# Patient Record
Sex: Female | Born: 1955 | Race: White | Hispanic: No | Smoking: Never smoker
Health system: Southern US, Community
[De-identification: ages and names within clinical notes are randomized; demographics above are authoritative.]

## PROBLEM LIST (undated history)

## (undated) DIAGNOSIS — G4700 Insomnia, unspecified: Secondary | ICD-10-CM

## (undated) DIAGNOSIS — J45909 Unspecified asthma, uncomplicated: Secondary | ICD-10-CM

## (undated) DIAGNOSIS — T8859XA Other complications of anesthesia, initial encounter: Secondary | ICD-10-CM

## (undated) DIAGNOSIS — K219 Gastro-esophageal reflux disease without esophagitis: Secondary | ICD-10-CM

## (undated) DIAGNOSIS — M797 Fibromyalgia: Secondary | ICD-10-CM

## (undated) DIAGNOSIS — K5792 Diverticulitis of intestine, part unspecified, without perforation or abscess without bleeding: Secondary | ICD-10-CM

## (undated) DIAGNOSIS — F419 Anxiety disorder, unspecified: Secondary | ICD-10-CM

## (undated) DIAGNOSIS — Z889 Allergy status to unspecified drugs, medicaments and biological substances status: Secondary | ICD-10-CM

## (undated) DIAGNOSIS — N819 Female genital prolapse, unspecified: Secondary | ICD-10-CM

## (undated) DIAGNOSIS — J342 Deviated nasal septum: Secondary | ICD-10-CM

## (undated) DIAGNOSIS — F32A Depression, unspecified: Secondary | ICD-10-CM

## (undated) DIAGNOSIS — F329 Major depressive disorder, single episode, unspecified: Secondary | ICD-10-CM

## (undated) DIAGNOSIS — E079 Disorder of thyroid, unspecified: Secondary | ICD-10-CM

## (undated) DIAGNOSIS — T4145XA Adverse effect of unspecified anesthetic, initial encounter: Secondary | ICD-10-CM

## (undated) HISTORY — PX: VAGINAL PROLAPSE REPAIR: SHX830

## (undated) HISTORY — DX: Disorder of thyroid, unspecified: E07.9

## (undated) HISTORY — DX: Diverticulitis of intestine, part unspecified, without perforation or abscess without bleeding: K57.92

## (undated) HISTORY — PX: TONSILLECTOMY: SUR1361

## (undated) HISTORY — PX: CARPAL TUNNEL RELEASE: SHX101

## (undated) HISTORY — PX: EXCISIONAL HEMORRHOIDECTOMY: SHX1541

## (undated) HISTORY — DX: Female genital prolapse, unspecified: N81.9

---

## 1994-03-29 HISTORY — PX: ABDOMINAL HYSTERECTOMY: SHX81

## 2002-08-23 ENCOUNTER — Encounter: Payer: Self-pay | Admitting: Internal Medicine

## 2002-08-23 ENCOUNTER — Ambulatory Visit (HOSPITAL_COMMUNITY): Admission: RE | Admit: 2002-08-23 | Discharge: 2002-08-23 | Payer: Self-pay | Admitting: Internal Medicine

## 2004-04-10 ENCOUNTER — Ambulatory Visit (HOSPITAL_COMMUNITY): Admission: RE | Admit: 2004-04-10 | Discharge: 2004-04-10 | Payer: Self-pay | Admitting: Internal Medicine

## 2005-06-07 ENCOUNTER — Ambulatory Visit (HOSPITAL_COMMUNITY): Admission: RE | Admit: 2005-06-07 | Discharge: 2005-06-07 | Payer: Self-pay | Admitting: Obstetrics and Gynecology

## 2006-09-21 ENCOUNTER — Ambulatory Visit (HOSPITAL_COMMUNITY): Admission: RE | Admit: 2006-09-21 | Discharge: 2006-09-21 | Payer: Self-pay | Admitting: Obstetrics and Gynecology

## 2007-10-17 ENCOUNTER — Ambulatory Visit (HOSPITAL_COMMUNITY): Admission: RE | Admit: 2007-10-17 | Discharge: 2007-10-17 | Payer: Self-pay | Admitting: Obstetrics and Gynecology

## 2008-11-28 ENCOUNTER — Ambulatory Visit (HOSPITAL_COMMUNITY): Admission: RE | Admit: 2008-11-28 | Discharge: 2008-11-28 | Payer: Self-pay | Admitting: Obstetrics and Gynecology

## 2010-02-20 ENCOUNTER — Ambulatory Visit (HOSPITAL_COMMUNITY): Admission: RE | Admit: 2010-02-20 | Discharge: 2010-02-20 | Payer: Self-pay | Admitting: Obstetrics and Gynecology

## 2010-04-19 ENCOUNTER — Encounter: Payer: Self-pay | Admitting: Obstetrics and Gynecology

## 2011-01-28 HISTORY — PX: CHOLECYSTECTOMY: SHX55

## 2011-03-02 ENCOUNTER — Other Ambulatory Visit (HOSPITAL_COMMUNITY): Payer: Self-pay | Admitting: Obstetrics and Gynecology

## 2011-03-02 DIAGNOSIS — Z1231 Encounter for screening mammogram for malignant neoplasm of breast: Secondary | ICD-10-CM

## 2011-03-29 ENCOUNTER — Ambulatory Visit
Admission: RE | Admit: 2011-03-29 | Discharge: 2011-03-29 | Disposition: A | Discharge status: Home / Self Care | Payer: BC Managed Care – PPO | Source: Ambulatory Visit | Attending: Obstetrics and Gynecology | Admitting: Obstetrics and Gynecology

## 2011-03-29 DIAGNOSIS — Z1231 Encounter for screening mammogram for malignant neoplasm of breast: Secondary | ICD-10-CM

## 2011-09-21 ENCOUNTER — Encounter (INDEPENDENT_AMBULATORY_CARE_PROVIDER_SITE_OTHER): Payer: Self-pay | Admitting: General Surgery

## 2011-09-21 ENCOUNTER — Ambulatory Visit (INDEPENDENT_AMBULATORY_CARE_PROVIDER_SITE_OTHER): Payer: BC Managed Care – PPO | Admitting: General Surgery

## 2011-09-21 VITALS — BP 118/74 | HR 74 | Temp 97.1°F | Ht 68.0 in | Wt 144.8 lb

## 2011-09-21 DIAGNOSIS — N6452 Nipple discharge: Secondary | ICD-10-CM

## 2011-09-21 DIAGNOSIS — N6459 Other signs and symptoms in breast: Secondary | ICD-10-CM

## 2011-09-21 NOTE — Progress Notes (Addendum)
Patient ID: Mindy Grant, female   DOB: 12/05/1955, 56 y.o.   MRN: [id number]  Chief Complaint  Patient presents with  . other    eval nipple d/c    HPI Mindy Grant is a 56 y.o. female.   HPI 40 yof who presents with about an 8 month history of left sided spontaneous clear nipple discharge.  It took her a while to figure out it was from her nipple. She had one episode of some pink discharge.  She reports no prior breast history and no other breast complaints.  She has no family history.  She notes discharge mostly on her gown when she gets up in the morning.  She underwent nl mmg and an u/s that shows dilated subareolar ducts.  She comes in today to discuss options.  Past Medical History  Diagnosis Date  . Thyroid disease     Past Surgical History  Procedure Date  . Cholecystectomy 01/2011  . Abdominal hysterectomy 1996    Family History  Problem Relation Age of Onset  . Cancer Mother     lung    Social History History  Substance Use Topics  . Smoking status: Never Smoker   . Smokeless tobacco: Not on file  . Alcohol Use: No    Allergies  Allergen Reactions  . Codeine Itching    Current Outpatient Prescriptions  Medication Sig Dispense Refill  . ALPRAZolam (XANAX) 0.5 MG tablet       . bumetanide (BUMEX) 1 MG tablet       . buPROPion (WELLBUTRIN XL) 150 MG 24 hr tablet       . fluconazole (DIFLUCAN) 150 MG tablet       . ketoconazole (NIZORAL) 2 % cream       . levothyroxine (SYNTHROID, LEVOTHROID) 75 MCG tablet       . meloxicam (MOBIC) 15 MG tablet       . omeprazole (PRILOSEC) 40 MG capsule       . SUMAtriptan (IMITREX) 100 MG tablet       . SUPRAX 400 MG tablet       . VIVELLE-DOT 0.1 MG/24HR       . cephALEXin (KEFLEX) 500 MG capsule       . ciprofloxacin (CIPRO) 500 MG tablet         Review of Systems Review of Systems  Constitutional: Negative for fever, chills and unexpected weight change.  HENT: Negative for hearing loss, congestion, sore throat,  trouble swallowing and voice change.   Eyes: Negative for visual disturbance.  Respiratory: Negative for cough and wheezing.   Cardiovascular: Positive for leg swelling (left side). Negative for chest pain and palpitations.  Gastrointestinal: Positive for nausea. Negative for vomiting, abdominal pain, diarrhea, constipation, blood in stool, abdominal distention and anal bleeding.  Genitourinary: Negative for hematuria, vaginal bleeding and difficulty urinating.  Musculoskeletal: Negative for arthralgias.  Skin: Negative for rash and wound.  Neurological: Positive for headaches. Negative for seizures and syncope.  Hematological: Negative for adenopathy. Does not bruise/bleed easily.  Psychiatric/Behavioral: Negative for confusion.    Blood pressure 118/74, pulse 74, temperature 97.1 F (36.2 C), temperature source Temporal, height 5\' 8"  (1.727 m), weight 144 lb 12.8 oz (65.681 kg), SpO2 97.00%.  Physical Exam Physical Exam  Vitals reviewed. Constitutional: She appears well-developed and well-nourished.  Cardiovascular: Normal rate, regular rhythm and normal heart sounds.   Pulmonary/Chest: Effort normal and breath sounds normal. She has no wheezes. She has no rales. Right breast  exhibits no inverted nipple, no mass, no nipple discharge, no skin change and no tenderness. Left breast exhibits nipple discharge (single central duct with yellow discharge, she reports no change in her nipple appearance she has noticed). Left breast exhibits no inverted nipple, no mass, no skin change and no tenderness. Breasts are symmetrical.  Lymphadenopathy:    She has no cervical adenopathy.    Data Reviewed Mammogram and u/s reports and images viewed from outside facility  Assessment    Left nipple discharge    Plan    She has unilateral spontaneous longstanding left nipple discharge.  We discussed continued observation, additional imaging or proceeding to duct excision.  I do think she will need  surgery but we are going to proceed with attempt at ductogram first and then I will see her back.       Nichalos Brenton 09/21/2011, 10:02 PM

## 2011-09-22 ENCOUNTER — Telehealth (INDEPENDENT_AMBULATORY_CARE_PROVIDER_SITE_OTHER): Payer: Self-pay

## 2011-09-22 NOTE — Telephone Encounter (Signed)
Returned my call. I gave her the info on her appts. The pt had lots of questions about whether she needed to have the ductogram done and was this the same procedure as having cancer. I advised pt that she needed to get this done and this was not the same as having breast cancer. I advised pt that we would call pt after her results are completed.

## 2011-09-22 NOTE — Telephone Encounter (Signed)
LMOM at home and cell to call me b/c I have her scheduled for a ductogram at [facility] on 6/28 arrive at 10:15 for 10:30. I also moved her f/u appt with Dr Dwain Sarna to 7/9 11:30

## 2011-09-24 ENCOUNTER — Ambulatory Visit
Admission: RE | Admit: 2011-09-24 | Discharge: 2011-09-24 | Disposition: A | Discharge status: Home / Self Care | Payer: BC Managed Care – PPO | Source: Ambulatory Visit | Attending: General Surgery | Admitting: General Surgery

## 2011-09-24 ENCOUNTER — Other Ambulatory Visit (INDEPENDENT_AMBULATORY_CARE_PROVIDER_SITE_OTHER): Payer: Self-pay | Admitting: General Surgery

## 2011-09-24 DIAGNOSIS — N6452 Nipple discharge: Secondary | ICD-10-CM

## 2011-09-29 ENCOUNTER — Ambulatory Visit (INDEPENDENT_AMBULATORY_CARE_PROVIDER_SITE_OTHER): Payer: BC Managed Care – PPO | Admitting: General Surgery

## 2011-09-29 ENCOUNTER — Encounter (INDEPENDENT_AMBULATORY_CARE_PROVIDER_SITE_OTHER): Payer: Self-pay | Admitting: General Surgery

## 2011-09-29 VITALS — BP 128/86 | HR 70 | Temp 97.2°F | Resp 12 | Ht 67.0 in | Wt 141.8 lb

## 2011-09-29 DIAGNOSIS — N6452 Nipple discharge: Secondary | ICD-10-CM | POA: Insufficient documentation

## 2011-09-29 DIAGNOSIS — N6459 Other signs and symptoms in breast: Secondary | ICD-10-CM

## 2011-09-29 NOTE — Progress Notes (Signed)
Subjective:     Patient ID: Mindy Grant, female   DOB: 05/04/1955, 56 y.o.   MRN: [id number]  HPI  This is a 56 yo female I recently saw with a history of spontaneous left-sided nipple discharge. I sent her for an ultrasound and  attempt at a ductogram last week with the results as listed below. She comes in today to discuss these results. She reports no interval complaints. She continues to have left-sided discharge. Review of Systems LEFT BREAST ULTRASOUND  Comparison: Prior studies  On physical exam, single duct clear yellow nipple discharge was  elicited from a centrally located duct - left nipple. There is no  palpable mass.  Findings: Ultrasound is performed, showing dilated left subareolar  ducts. 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 There are  no additional findings.  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 Surgical excision is recommended. The patient plans  to see Dr. Kayona Foor for follow-up evaluation on 10/05/2011.  RECOMMENDATION:  Surgical excision.  BI-RADS CATEGORY 4: Suspicious abnormality - biopsy should be  considered.     Objective:   Physical Exam     Assessment:     Left nipple discharge    Plan:     We discussed excision of left breast ductal system due to her symptoms as well as the ultrasound appearance. I discussed the procedure as well as the postoperative recovery. We discussed the risk of bleeding and infection. We discussed we're doing this did prove that there is breast cancer associated with it. We will plan to do this next week.      

## 2011-10-04 ENCOUNTER — Encounter (HOSPITAL_BASED_OUTPATIENT_CLINIC_OR_DEPARTMENT_OTHER): Payer: Self-pay | Admitting: *Deleted

## 2011-10-04 ENCOUNTER — Encounter (HOSPITAL_BASED_OUTPATIENT_CLINIC_OR_DEPARTMENT_OTHER)
Admission: RE | Admit: 2011-10-04 | Discharge: 2011-10-04 | Disposition: A | Discharge status: Home / Self Care | Payer: BC Managed Care – PPO | Source: Ambulatory Visit | Attending: General Surgery | Admitting: General Surgery

## 2011-10-04 LAB — CBC
HCT: 38.5 % (ref 36.0–46.0)
Hemoglobin: 13.1 g/dL (ref 12.0–15.0)
MCH: 30.4 pg (ref 26.0–34.0)
MCHC: 34 g/dL (ref 30.0–36.0)
MCV: 89.3 fL (ref 78.0–100.0)
RBC: 4.31 MIL/uL (ref 3.87–5.11)

## 2011-10-04 LAB — BASIC METABOLIC PANEL
BUN: 9 mg/dL (ref 6–23)
CO2: 25 mEq/L (ref 19–32)
Chloride: 101 mEq/L (ref 96–112)
Glucose, Bld: 109 mg/dL — ABNORMAL HIGH (ref 70–99)
Potassium: 3.6 mEq/L (ref 3.5–5.1)

## 2011-10-04 NOTE — Progress Notes (Signed)
To come in for preop labs per dr wakefield Pt nervous-does not sleep well Says heart rate will go up when nervous-no meds-never had to see a dr.

## 2011-10-05 ENCOUNTER — Telehealth (INDEPENDENT_AMBULATORY_CARE_PROVIDER_SITE_OTHER): Payer: Self-pay | Admitting: General Surgery

## 2011-10-05 ENCOUNTER — Encounter (INDEPENDENT_AMBULATORY_CARE_PROVIDER_SITE_OTHER): Payer: BC Managed Care – PPO | Admitting: General Surgery

## 2011-10-05 ENCOUNTER — Ambulatory Visit (INDEPENDENT_AMBULATORY_CARE_PROVIDER_SITE_OTHER): Payer: Self-pay | Admitting: General Surgery

## 2011-10-05 NOTE — Telephone Encounter (Signed)
Patient called in explaining that she is having an issue with anxiety due to her surgery tomorrow.  She explained that she has a Rx from Dr. Donnie Mesa her GYN for this problem before but she states the xanax makes her hyper.  I explained to her that because he is the one who prescribed the medication that she should call his office and ask him change the Rx to something else.  She said she would do this.

## 2011-10-06 ENCOUNTER — Ambulatory Visit (HOSPITAL_BASED_OUTPATIENT_CLINIC_OR_DEPARTMENT_OTHER)
Admission: RE | Admit: 2011-10-06 | Discharge: 2011-10-06 | Disposition: A | Discharge status: Home / Self Care | Payer: BC Managed Care – PPO | Source: Ambulatory Visit | Attending: General Surgery | Admitting: General Surgery

## 2011-10-06 ENCOUNTER — Encounter (HOSPITAL_BASED_OUTPATIENT_CLINIC_OR_DEPARTMENT_OTHER): Payer: Self-pay | Admitting: *Deleted

## 2011-10-06 ENCOUNTER — Encounter (HOSPITAL_BASED_OUTPATIENT_CLINIC_OR_DEPARTMENT_OTHER)
Admission: RE | Disposition: A | Discharge status: Home / Self Care | Payer: Self-pay | Source: Ambulatory Visit | Attending: General Surgery

## 2011-10-06 ENCOUNTER — Encounter (HOSPITAL_BASED_OUTPATIENT_CLINIC_OR_DEPARTMENT_OTHER): Payer: Self-pay | Admitting: Certified Registered"

## 2011-10-06 ENCOUNTER — Ambulatory Visit (HOSPITAL_BASED_OUTPATIENT_CLINIC_OR_DEPARTMENT_OTHER): Payer: BC Managed Care – PPO | Admitting: Certified Registered"

## 2011-10-06 ENCOUNTER — Telehealth (INDEPENDENT_AMBULATORY_CARE_PROVIDER_SITE_OTHER): Payer: Self-pay | Admitting: General Surgery

## 2011-10-06 DIAGNOSIS — N6029 Fibroadenosis of unspecified breast: Secondary | ICD-10-CM | POA: Insufficient documentation

## 2011-10-06 DIAGNOSIS — D249 Benign neoplasm of unspecified breast: Secondary | ICD-10-CM

## 2011-10-06 DIAGNOSIS — N6459 Other signs and symptoms in breast: Secondary | ICD-10-CM | POA: Insufficient documentation

## 2011-10-06 DIAGNOSIS — K219 Gastro-esophageal reflux disease without esophagitis: Secondary | ICD-10-CM | POA: Insufficient documentation

## 2011-10-06 HISTORY — DX: Other complications of anesthesia, initial encounter: T88.59XA

## 2011-10-06 HISTORY — DX: Major depressive disorder, single episode, unspecified: F32.9

## 2011-10-06 HISTORY — DX: Adverse effect of unspecified anesthetic, initial encounter: T41.45XA

## 2011-10-06 HISTORY — DX: Deviated nasal septum: J34.2

## 2011-10-06 HISTORY — PX: BREAST DUCTAL SYSTEM EXCISION: SHX5242

## 2011-10-06 HISTORY — DX: Anxiety disorder, unspecified: F41.9

## 2011-10-06 HISTORY — DX: Fibromyalgia: M79.7

## 2011-10-06 HISTORY — DX: Depression, unspecified: F32.A

## 2011-10-06 HISTORY — DX: Gastro-esophageal reflux disease without esophagitis: K21.9

## 2011-10-06 HISTORY — DX: Insomnia, unspecified: G47.00

## 2011-10-06 HISTORY — DX: Unspecified asthma, uncomplicated: J45.909

## 2011-10-06 HISTORY — DX: Allergy status to unspecified drugs, medicaments and biological substances: Z88.9

## 2011-10-06 SURGERY — EXCISION DUCTAL SYSTEM BREAST
Anesthesia: General | Site: Breast | Laterality: Left | Wound class: Clean

## 2011-10-06 MED ORDER — OXYCODONE-ACETAMINOPHEN 5-325 MG PO TABS
1.0000 | ORAL_TABLET | Freq: Once | ORAL | Status: AC
  Administered 2011-10-06: 1 via ORAL

## 2011-10-06 MED ORDER — DEXAMETHASONE SODIUM PHOSPHATE 4 MG/ML IJ SOLN
INTRAMUSCULAR | Status: DC | PRN
  Administered 2011-10-06: 6 mg via INTRAVENOUS

## 2011-10-06 MED ORDER — FENTANYL CITRATE 0.05 MG/ML IJ SOLN
50.0000 ug | INTRAMUSCULAR | Status: AC | PRN
  Administered 2011-10-06 (×2): 25 ug via INTRAVENOUS

## 2011-10-06 MED ORDER — HYDROMORPHONE HCL PF 1 MG/ML IJ SOLN
0.2500 mg | INTRAMUSCULAR | Status: DC | PRN
  Administered 2011-10-06: 0.25 mg via INTRAVENOUS

## 2011-10-06 MED ORDER — MIDAZOLAM HCL 5 MG/5ML IJ SOLN
INTRAMUSCULAR | Status: DC | PRN
  Administered 2011-10-06: 1 mg via INTRAVENOUS

## 2011-10-06 MED ORDER — ONDANSETRON HCL 4 MG/2ML IJ SOLN
INTRAMUSCULAR | Status: DC | PRN
  Administered 2011-10-06: 4 mg via INTRAVENOUS

## 2011-10-06 MED ORDER — PROMETHAZINE HCL 25 MG RE SUPP
25.0000 mg | Freq: Once | RECTAL | Status: AC
  Administered 2011-10-06: 25 mg via RECTAL

## 2011-10-06 MED ORDER — FENTANYL CITRATE 0.05 MG/ML IJ SOLN
INTRAMUSCULAR | Status: DC | PRN
  Administered 2011-10-06: 50 ug via INTRAVENOUS
  Administered 2011-10-06: 25 ug via INTRAVENOUS

## 2011-10-06 MED ORDER — PROMETHAZINE HCL 25 MG/ML IJ SOLN: 6.2500 mg | Freq: Once | INTRAMUSCULAR | Status: DC

## 2011-10-06 MED ORDER — 0.9 % SODIUM CHLORIDE (POUR BTL) OPTIME
TOPICAL | Status: DC | PRN
  Administered 2011-10-06: 1000 mL

## 2011-10-06 MED ORDER — BUPIVACAINE HCL (PF) 0.25 % IJ SOLN
INTRAMUSCULAR | Status: DC | PRN
  Administered 2011-10-06: 10 mL

## 2011-10-06 MED ORDER — LIDOCAINE HCL (CARDIAC) 20 MG/ML IV SOLN
INTRAVENOUS | Status: DC | PRN
  Administered 2011-10-06: 60 mg via INTRAVENOUS

## 2011-10-06 MED ORDER — LACTATED RINGERS IV SOLN
INTRAVENOUS | Status: DC
  Administered 2011-10-06 (×2): via INTRAVENOUS

## 2011-10-06 MED ORDER — PROPOFOL 10 MG/ML IV EMUL
INTRAVENOUS | Status: DC | PRN
  Administered 2011-10-06: 250 mg via INTRAVENOUS

## 2011-10-06 MED ORDER — OXYCODONE-ACETAMINOPHEN 5-325 MG PO TABS: 1.0000 | ORAL_TABLET | ORAL | Status: AC | PRN

## 2011-10-06 SURGICAL SUPPLY — 56 items
ADH SKN CLS APL DERMABOND .7 (GAUZE/BANDAGES/DRESSINGS)
APL SKNCLS STERI-STRIP NONHPOA (GAUZE/BANDAGES/DRESSINGS) ×1
APPLIER CLIP 9.375 MED OPEN (MISCELLANEOUS)
APR CLP MED 9.3 20 MLT OPN (MISCELLANEOUS)
BENZOIN TINCTURE PRP APPL 2/3 (GAUZE/BANDAGES/DRESSINGS) ×2 IMPLANT
BINDER BREAST LRG (GAUZE/BANDAGES/DRESSINGS) IMPLANT
BINDER BREAST MEDIUM (GAUZE/BANDAGES/DRESSINGS) IMPLANT
BINDER BREAST XLRG (GAUZE/BANDAGES/DRESSINGS) IMPLANT
BINDER BREAST XXLRG (GAUZE/BANDAGES/DRESSINGS) IMPLANT
BLADE SURG 15 STRL LF DISP TIS (BLADE) ×1 IMPLANT
BLADE SURG 15 STRL SS (BLADE) ×2
CANISTER SUCTION 1200CC (MISCELLANEOUS) IMPLANT
CHLORAPREP W/TINT 26ML (MISCELLANEOUS) ×2 IMPLANT
CLIP APPLIE 9.375 MED OPEN (MISCELLANEOUS) IMPLANT
CLOTH BEACON ORANGE TIMEOUT ST (SAFETY) ×2 IMPLANT
COVER MAYO STAND STRL (DRAPES) ×2 IMPLANT
COVER TABLE BACK 60X90 (DRAPES) ×2 IMPLANT
DECANTER SPIKE VIAL GLASS SM (MISCELLANEOUS) IMPLANT
DERMABOND ADVANCED (GAUZE/BANDAGES/DRESSINGS)
DERMABOND ADVANCED .7 DNX12 (GAUZE/BANDAGES/DRESSINGS) IMPLANT
DEVICE DUBIN W/COMP PLATE 8390 (MISCELLANEOUS) IMPLANT
DRAPE PED LAPAROTOMY (DRAPES) ×2 IMPLANT
DRSG TEGADERM 4X4.75 (GAUZE/BANDAGES/DRESSINGS) ×2 IMPLANT
ELECT COATED BLADE 2.86 ST (ELECTRODE) ×2 IMPLANT
ELECT REM PT RETURN 9FT ADLT (ELECTROSURGICAL) ×2
ELECTRODE REM PT RTRN 9FT ADLT (ELECTROSURGICAL) ×1 IMPLANT
GAUZE SPONGE 4X4 12PLY STRL LF (GAUZE/BANDAGES/DRESSINGS) ×2 IMPLANT
GLOVE BIO SURGEON STRL SZ7 (GLOVE) ×2 IMPLANT
GLOVE BIO SURGEON STRL SZ7.5 (GLOVE) ×1 IMPLANT
GLOVE BIOGEL PI IND STRL 7.5 (GLOVE) ×1 IMPLANT
GLOVE BIOGEL PI IND STRL 8 (GLOVE) IMPLANT
GLOVE BIOGEL PI INDICATOR 7.5 (GLOVE) ×1
GLOVE BIOGEL PI INDICATOR 8 (GLOVE) ×1
GOWN PREVENTION PLUS XLARGE (GOWN DISPOSABLE) ×2 IMPLANT
GOWN STRL REIN 2XL LVL4 (GOWN DISPOSABLE) ×1 IMPLANT
NDL HYPO 25X1 1.5 SAFETY (NEEDLE) ×1 IMPLANT
NEEDLE HYPO 25X1 1.5 SAFETY (NEEDLE) ×2 IMPLANT
NS IRRIG 1000ML POUR BTL (IV SOLUTION) ×1 IMPLANT
PACK BASIN DAY SURGERY FS (CUSTOM PROCEDURE TRAY) ×2 IMPLANT
PENCIL BUTTON HOLSTER BLD 10FT (ELECTRODE) ×2 IMPLANT
SLEEVE SCD COMPRESS KNEE MED (MISCELLANEOUS) ×2 IMPLANT
SPONGE LAP 4X18 X RAY DECT (DISPOSABLE) ×2 IMPLANT
STRIP CLOSURE SKIN 1/2X4 (GAUZE/BANDAGES/DRESSINGS) ×2 IMPLANT
SUT MNCRL AB 4-0 PS2 18 (SUTURE) ×2 IMPLANT
SUT MON AB 5-0 PS2 18 (SUTURE) ×1 IMPLANT
SUT SILK 2 0 SH (SUTURE) ×2 IMPLANT
SUT VIC AB 2-0 SH 27 (SUTURE)
SUT VIC AB 2-0 SH 27XBRD (SUTURE) ×1 IMPLANT
SUT VIC AB 3-0 SH 27 (SUTURE) ×2
SUT VIC AB 3-0 SH 27X BRD (SUTURE) ×1 IMPLANT
SYR CONTROL 10ML LL (SYRINGE) ×2 IMPLANT
TOWEL OR 17X24 6PK STRL BLUE (TOWEL DISPOSABLE) ×2 IMPLANT
TOWEL OR NON WOVEN STRL DISP B (DISPOSABLE) ×2 IMPLANT
TUBE CONNECTING 20X1/4 (TUBING) IMPLANT
WATER STERILE IRR 1000ML POUR (IV SOLUTION) ×1 IMPLANT
YANKAUER SUCT BULB TIP NO VENT (SUCTIONS) IMPLANT

## 2011-10-06 NOTE — Interval H&P Note (Signed)
History and Physical Interval Note:  10/06/2011 8:22 AM  Mindy Grant  has presented today for surgery, with the diagnosis of left nipple discharge  The various methods of treatment have been discussed with the patient and family. After consideration of risks, benefits and other options for treatment, the patient has consented to  Procedure(s) (LRB): EXCISION DUCTAL SYSTEM BREAST (Left) as a surgical intervention .  The patient's history has been reviewed, patient examined, no change in status, stable for surgery.  I have reviewed the patients' chart and labs.  Questions were answered to the patient's satisfaction.     [name]

## 2011-10-06 NOTE — Anesthesia Procedure Notes (Signed)
Procedure Name: LMA Insertion Date/Time: 10/06/2011 8:40 AM Performed by: Verlan Friends Pre-anesthesia Checklist: Patient identified, Emergency Drugs available, Suction available, Patient being monitored and Timeout performed Patient Re-evaluated:Patient Re-evaluated prior to inductionOxygen Delivery Method: Circle System Utilized Preoxygenation: Pre-oxygenation with 100% oxygen Intubation Type: IV induction Ventilation: Mask ventilation without difficulty LMA: LMA inserted LMA Size: 4.0 Number of attempts: 1 Airway Equipment and Method: bite block Placement Confirmation: positive ETCO2 Tube secured with: Tape Dental Injury: Teeth and Oropharynx as per pre-operative assessment

## 2011-10-06 NOTE — Op Note (Signed)
Preoperative diagnosis: Left breast nipple discharge with abnormal ductogram Postoperative diagnoses: Same as above Procedure: Left breast duct excision Surgeon: Dr. Harden Mo Anesthesia: Gen. With LMA Estimated blood loss: Minimal Specimens: #1 retroareolar breast tissue #2 left breast ductal system marked with short stitch superior, long stitch lateral, double stitch deep Drains: None Complications: None Disposition to recovery room in stable condition Sponge and needle count was correct x2 at operation  Indications: This 56 year old female with a history of spontaneous, unilateral clear breast discharge. This has persisted. She underwent an ultrasound and a ductogram showing dilated ducts as well as some wall thickening in a retroareolar duct. She and I discussed excision of this ductal system.  Procedure: After informed consent was obtained the patient was taken the operating room. She had sequential compression devices placed on the legs. She was then placed under general anesthesia with an LMA. She was prepped and draped in the standard sterile surgical fashion. A surgical timeout was performed.  I made a periareolar incision on the lateral portion of her left nipple areolar complex. I cannulated the leaking duct with a lacrimal duct probe. I then proceeded to excise this ductal system in its entirety. There was still a small amount of retroareolar tissue excised with a knife and sent as a separate specimen. There was no other abnormal tissue present. I then obtained hemostasis. I then closed the deep breast tissue with a 3-0 Vicryl suture. The dermis was closed with 3-0 Vicryl and the skin was closed with a 5-0 Monocryl. I then infiltrated 10 cc of quarter percent Marcaine. I then placed Steri-Strips and a sterile dressing. She tolerated as well as activity and transferred to recovery in stable.

## 2011-10-06 NOTE — Anesthesia Postprocedure Evaluation (Signed)
  Anesthesia Post-op Note  Patient: Mindy Grant  Procedure(s) Performed: Procedure(s) (LRB): EXCISION DUCTAL SYSTEM BREAST (Left)  Patient Location: PACU  Anesthesia Type: General  Level of Consciousness: awake  Airway and Oxygen Therapy: Patient Spontanous Breathing and Patient connected to face mask oxygen  Post-op Pain: mild  Post-op Assessment: Post-op Vital signs reviewed, Patient's Cardiovascular Status Stable, Respiratory Function Stable, Patent Airway and No signs of Nausea or vomiting  Post-op Vital Signs: Reviewed and stable  Complications: No apparent anesthesia complications

## 2011-10-06 NOTE — Anesthesia Preprocedure Evaluation (Signed)
Anesthesia Evaluation  Patient identified by MRN, date of birth, ID band Patient awake    Reviewed: Allergy & Precautions, H&P , NPO status , Patient's Chart, lab work & pertinent test results  History of Anesthesia Complications (+) PROLONGED EMERGENCE  Airway Mallampati: I TM Distance: >3 FB Neck ROM: Full    Dental No notable dental hx. (+) Teeth Intact and Dental Advisory Given   Pulmonary neg pulmonary ROS,  breath sounds clear to auscultation  Pulmonary exam normal       Cardiovascular negative cardio ROS  Rhythm:Regular Rate:Normal     Neuro/Psych PSYCHIATRIC DISORDERS negative neurological ROS     GI/Hepatic Neg liver ROS, GERD-  Medicated and Controlled,  Endo/Other  negative endocrine ROS  Renal/GU negative Renal ROS  negative genitourinary   Musculoskeletal   Abdominal   Peds  Hematology negative hematology ROS (+)   Anesthesia Other Findings   Reproductive/Obstetrics negative OB ROS                           Anesthesia Physical Anesthesia Plan  ASA: II  Anesthesia Plan: General   Post-op Pain Management:    Induction: Intravenous  Airway Management Planned: LMA  Additional Equipment:   Intra-op Plan:   Post-operative Plan: Extubation in OR  Informed Consent: I have reviewed the patients History and Physical, chart, labs and discussed the procedure including the risks, benefits and alternatives for the proposed anesthesia with the patient or authorized representative who has indicated his/her understanding and acceptance.   Dental advisory given  Plan Discussed with: CRNA  Anesthesia Plan Comments:         Anesthesia Quick Evaluation

## 2011-10-06 NOTE — H&P (View-Only) (Signed)
Subjective:     Patient ID: Mindy Grant, female   DOB: 1956-01-24, 56 y.o.   MRN: [id number]  HPI  This is a 56 yo female I recently saw with a history of spontaneous left-sided nipple discharge. I sent her for an ultrasound and  attempt at a ductogram last week with the results as listed below. She comes in today to discuss these results. She reports no interval complaints. She continues to have left-sided discharge. Review of Systems LEFT BREAST ULTRASOUND  Comparison: Prior studies  On physical exam, single duct clear yellow nipple discharge was  elicited from a centrally located duct - left nipple. There is no  palpable mass.  Findings: Ultrasound is performed, showing dilated left subareolar  ducts. 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 There are  no additional findings.  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 Surgical excision is recommended. The patient plans  to see Dr. Dwain Sarna for follow-up evaluation on 10/05/2011.  RECOMMENDATION:  Surgical excision.  BI-RADS CATEGORY 4: Suspicious abnormality - biopsy should be  considered.     Objective:   Physical Exam     Assessment:     Left nipple discharge    Plan:     We discussed excision of left breast ductal system due to her symptoms as well as the ultrasound appearance. I discussed the procedure as well as the postoperative recovery. We discussed the risk of bleeding and infection. We discussed we're doing this did prove that there is breast cancer associated with it. We will plan to do this next week.

## 2011-10-06 NOTE — Progress Notes (Signed)
Notified Dr. Sampson Goon of pt's heart rate 40's after giving Dilaudid 0.25mg  and pt c/o pain in left breast -- ordered to give Fentanyl and watch Heart rate.

## 2011-10-06 NOTE — Transfer of Care (Signed)
Immediate Anesthesia Transfer of Care Note  Patient: Mindy Grant  Procedure(s) Performed: Procedure(s) (LRB): EXCISION DUCTAL SYSTEM BREAST (Left)  Patient Location: PACU  Anesthesia Type: General  Level of Consciousness: awake, alert , oriented and patient cooperative  Airway & Oxygen Therapy: Patient Spontanous Breathing and Patient connected to face mask oxygen  Post-op Assessment: Report given to PACU RN and Post -op Vital signs reviewed and stable  Post vital signs: Reviewed and stable  Complications: No apparent anesthesia complications

## 2011-10-06 NOTE — Telephone Encounter (Signed)
Pt's daughter calling to ask if Dr. Dwain Sarna would order antibiotics for her mother. She had surgery today, and is now coughing up "green stuff" and has a tight chest.  Dr. Dwain Sarna paged and updated; declined to order meds a this time, stating this happens sometimes after ET tube placement.  Called daughter and explained same, stating she should be encourage to continue to cough it up and spit it out.   Daughter then states she was coughing and congested before surgery.  Instructed daughter to watch pt closely and monitor for fever.  Encourage to cough and expectorate.  If URI, antibx will not cure a viral infection anyway.  Call back for fever >101.

## 2011-10-06 NOTE — Progress Notes (Signed)
Pt c/o of nausea. Dr Sampson Goon alerted, orders given.  Watching HR at this time. HR as low as 44 at times, and then right back into 60's.  Will continue to monitor closely, and alert Dr.  Zollie Scale, pt talking, all other VSS, no symptoms at this time.

## 2011-10-08 ENCOUNTER — Telehealth (INDEPENDENT_AMBULATORY_CARE_PROVIDER_SITE_OTHER): Payer: Self-pay

## 2011-10-08 ENCOUNTER — Encounter (HOSPITAL_BASED_OUTPATIENT_CLINIC_OR_DEPARTMENT_OTHER): Payer: Self-pay | Admitting: General Surgery

## 2011-10-08 NOTE — Telephone Encounter (Signed)
Called pt to notify her that the path is benign per Dr Dwain Sarna. The pt is worried about some swelling and feeling a knot at the breast. I advised pt if this gets worse with redness,fever,or chills she needs to call me back. The pt understands.

## 2011-10-18 ENCOUNTER — Encounter (INDEPENDENT_AMBULATORY_CARE_PROVIDER_SITE_OTHER): Payer: BC Managed Care – PPO | Admitting: General Surgery

## 2011-10-25 ENCOUNTER — Telehealth (INDEPENDENT_AMBULATORY_CARE_PROVIDER_SITE_OTHER): Payer: Self-pay | Admitting: General Surgery

## 2011-10-25 NOTE — Telephone Encounter (Signed)
Pt calling about significant breast pain, specifically in the late afternoon.  She is working daily (cleaning services), denies any fever or drainage or warmth to touch.  She is wearing a bra for support.  The pain is described as "pulling" and "throbbing."  She has been taking 1/2 of a percocet for pain med at night and Tylenol during the day.  Suggested she take 2 Aleve Q12H, as she cannot take Hydrocodone (extreme itching.)

## 2011-11-02 ENCOUNTER — Ambulatory Visit (INDEPENDENT_AMBULATORY_CARE_PROVIDER_SITE_OTHER): Payer: BC Managed Care – PPO | Admitting: General Surgery

## 2011-11-02 ENCOUNTER — Encounter (INDEPENDENT_AMBULATORY_CARE_PROVIDER_SITE_OTHER): Payer: Self-pay | Admitting: General Surgery

## 2011-11-02 VITALS — BP 140/72 | HR 76 | Resp 18 | Ht 66.0 in | Wt 145.0 lb

## 2011-11-02 DIAGNOSIS — Z09 Encounter for follow-up examination after completed treatment for conditions other than malignant neoplasm: Secondary | ICD-10-CM

## 2011-11-02 DIAGNOSIS — N6459 Other signs and symptoms in breast: Secondary | ICD-10-CM

## 2011-11-02 DIAGNOSIS — N6452 Nipple discharge: Secondary | ICD-10-CM

## 2011-11-02 MED ORDER — OXYCODONE-ACETAMINOPHEN 10-325 MG PO TABS: 1.0000 | ORAL_TABLET | Freq: Four times a day (QID) | ORAL | Status: AC | PRN

## 2011-11-02 NOTE — Progress Notes (Signed)
Subjective:     Patient ID: Mindy Grant, female   DOB: May 11, 1955, 56 y.o.   MRN: [id number]  HPI This is a 56-year female who I did a left breast duct excision with pathology being benign.  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  She returns today after her stepmother passed away.  She has pain in her left breast when she returned to work.  Review of Systems     Objective:   Physical Exam Left breast incision clean without infection    Assessment:     Left breast biopsy, benign    Plan:     I gave her some percocet, we discussed ice and sports bra.  I think this is related to working and stopping is not option.  Will see back in a few weeks.  I think time will help

## 2011-11-04 ENCOUNTER — Encounter (INDEPENDENT_AMBULATORY_CARE_PROVIDER_SITE_OTHER): Payer: BC Managed Care – PPO | Admitting: General Surgery

## 2011-11-30 ENCOUNTER — Encounter (INDEPENDENT_AMBULATORY_CARE_PROVIDER_SITE_OTHER): Payer: Self-pay | Admitting: General Surgery

## 2011-11-30 ENCOUNTER — Ambulatory Visit (INDEPENDENT_AMBULATORY_CARE_PROVIDER_SITE_OTHER): Payer: BC Managed Care – PPO | Admitting: General Surgery

## 2011-11-30 VITALS — BP 124/68 | HR 76 | Resp 16 | Ht 68.0 in | Wt 149.0 lb

## 2011-11-30 DIAGNOSIS — Z09 Encounter for follow-up examination after completed treatment for conditions other than malignant neoplasm: Secondary | ICD-10-CM

## 2011-11-30 NOTE — Patient Instructions (Signed)

## 2011-11-30 NOTE — Progress Notes (Signed)
Subjective:     Patient ID: Mindy Grant, female   DOB: Aug 08, 1955, 56 y.o.   MRN: [id number]  HPI This is a 56 year old female and is excision of a left breast ductal system. She has done well postoperatively except he had some pain the last time I saw her. This I think was related mostly  to her returning to work.she comes back in today doing well without any significant complaints.  Review of Systems     Objective:   Physical Exam Well-healed left breast incision without infection    Assessment:     Status post left breast duct excision    Plan:     She is back at full activity. I will see her back as needed. I discussed with her regular screening mammography, regular self exams, and clinical exams annually.

## 2012-02-16 DIAGNOSIS — K645 Perianal venous thrombosis: Secondary | ICD-10-CM | POA: Insufficient documentation

## 2012-03-02 ENCOUNTER — Other Ambulatory Visit: Payer: Self-pay | Admitting: Obstetrics and Gynecology

## 2012-03-02 DIAGNOSIS — Z1231 Encounter for screening mammogram for malignant neoplasm of breast: Secondary | ICD-10-CM

## 2012-05-11 ENCOUNTER — Ambulatory Visit: Payer: BC Managed Care – PPO

## 2012-07-25 ENCOUNTER — Ambulatory Visit (INDEPENDENT_AMBULATORY_CARE_PROVIDER_SITE_OTHER): Payer: BC Managed Care – PPO | Admitting: General Surgery

## 2012-07-25 ENCOUNTER — Encounter (INDEPENDENT_AMBULATORY_CARE_PROVIDER_SITE_OTHER): Payer: Self-pay | Admitting: General Surgery

## 2012-07-25 VITALS — BP 126/74 | HR 80 | Temp 98.4°F | Resp 18 | Ht 66.0 in | Wt 151.0 lb

## 2012-07-25 DIAGNOSIS — K6289 Other specified diseases of anus and rectum: Secondary | ICD-10-CM

## 2012-07-25 NOTE — Progress Notes (Signed)
Subjective:     Patient ID: Mindy Grant, female   DOB: [birth year], 57 y.o.   MRN: [id number]  HPI This is a 57 year old female that I know from a ductal excision for nipple discharge. She is doing fine from that and is about to go for her regularly scheduled mammogram. She presents after undergoing a internal/external hemorrhoidectomy x2 with anoscopy in December of 2013 in [place]. I have reviewed her operative report  she brought me. She underwent excision of the 6 and 9:00 hemorrhoidal complexes. These were closed with a running 3-0 Vicryl suture in a locking fashion. Postoperatively she had pain with her first bowel movement. Her biggest complaint is that since her surgery she feels like there is a bulge on the inside of her rectum to the left and that her stools do not come out straight but they travel towards the left when they come out. She states that she still has good control of her sphincter with no incontinence either to air or stool. She is having daily or every other day bowel movements but she does have to strain she feels like because it is not coming out in a straight fashion. She feels like she is irritated on the inside after having a bowel movement. She does note a small amount of bright red blood on the toilet paper as well. [place] She had been seen by her surgeon who would recommend her follow up in the colorectal surgery. She came to see me because I did prior surgery on her.  Review of Systems     Objective:   Physical Exam External exam with some small tags, rectal exam with good tone and no mass, she does not have a fissure Anoscopy shows small internal hemorrhoids, is not painful and really no abnormality at all    Assessment:     Pain/abnormal defecation s/p hemorrhoidectomy     Plan:     I cannot really find an anatomic cause of her complaint today. This may very well be a functional  issue. I'm going to refer her to see our colorectal surgeon. I offered to have her go back and see Dr. Katrinka Blazing and follow up with another colorectal surgeon which he had recommended. I agreed with is plan and really don't have source of her symptoms that I can identify.

## 2012-08-08 ENCOUNTER — Ambulatory Visit (INDEPENDENT_AMBULATORY_CARE_PROVIDER_SITE_OTHER): Payer: BC Managed Care – PPO | Admitting: General Surgery

## 2012-08-08 ENCOUNTER — Encounter (INDEPENDENT_AMBULATORY_CARE_PROVIDER_SITE_OTHER): Payer: Self-pay | Admitting: General Surgery

## 2012-08-08 VITALS — BP 122/80 | HR 78 | Temp 97.0°F | Resp 18 | Ht 66.0 in | Wt 150.5 lb

## 2012-08-08 DIAGNOSIS — R198 Other specified symptoms and signs involving the digestive system and abdomen: Secondary | ICD-10-CM

## 2012-08-08 DIAGNOSIS — K5900 Constipation, unspecified: Secondary | ICD-10-CM

## 2012-08-08 NOTE — Patient Instructions (Addendum)
  Fiber Chart  You should 25-30g of fiber per day and drinking 8 glasses of water to help your bowels move regularly.  In the chart below you can look up how much fiber you are getting in an average day.  If you are not getting enough fiber, you should add a fiber supplement to your diet.  Examples of this include Metamucil, FiberCon and Citrucel.  These can be purchased at your local grocery store or pharmacy.      http://www.[employer].edu/offices/health/nutritioncoach/AtoZ/handouts/Fiber.pdf    GETTING TO GOOD BOWEL HEALTH. Irregular bowel habits such as constipation can lead to many problems over time.  Having one soft bowel movement a day is the most important way to prevent further problems.  The anorectal canal is designed to handle stretching and feces to safely manage our ability to get rid of solid waste (feces, poop, stool) out of our body.  BUT, hard constipated stools can act like ripping concrete bricks causing inflamed hemorrhoids, anal fissures, abdominal pain and bloating.     The goal: ONE SOFT BOWEL MOVEMENT A DAY!  To have soft, regular bowel movements:    Drink at least 8 tall glasses of water a day.     Take plenty of fiber.  Fiber is the undigested part of plant food that passes into the colon, acting s "natures broom" to encourage bowel motility and movement.  Fiber can absorb and hold large amounts of water. This results in a larger, bulkier stool, which is soft and easier to pass. Work gradually over several weeks up to 6 servings a day of fiber (25g a day even more if needed) in the form of: o Vegetables -- Root (potatoes, carrots, turnips), leafy green (lettuce, salad greens, celery, spinach), or cooked high residue (cabbage, broccoli, etc) o Fruit -- Fresh (unpeeled skin & pulp), Dried (prunes, apricots, cherries, etc ),  or stewed ( applesauce)  o Whole grain breads, pasta, etc (whole wheat)  o Bran cereals    Bulking Agents -- This type of water-retaining fiber  generally is easily obtained each day by one of the following:  o Psyllium bran -- The psyllium plant is remarkable because its ground seeds can retain so much water. This product is available as Metamucil, Konsyl, Effersyllium, Per Diem Fiber, or the less expensive generic preparation in drug and health food stores. Although labeled a laxative, it really is not a laxative.  o Methylcellulose -- This is another fiber derived from wood which also retains water. It is available as Citrucel. o Polyethylene Glycol - and "artificial" fiber commonly called Miralax or Glycolax.  It is helpful for people with gassy or bloated feelings with regular fiber o Flax Seed - a less gassy fiber than psyllium   No reading or other relaxing activity while on the toilet. If bowel movements take longer than 5 minutes, you are too constipated.   AVOID CONSTIPATION.  High fiber and water intake usually takes care of this.  Sometimes a laxative is needed to stimulate more frequent bowel movements, but    Laxatives are not a good long-term solution as it can wear the colon out. o Osmotics (Milk of Magnesia, Fleets phosphosoda, Magnesium citrate, MiraLax, GoLytely) are safer than  o Stimulants (Senokot, Castor Oil, Dulcolax, Ex Lax)    o Do not take laxatives for more than 7days in a row.    IF SEVERELY CONSTIPATED, try a Bowel Retraining Program: o Do not use laxatives.  o Eat a diet high in   roughage, such as bran cereals and leafy vegetables.  o Drink six (6) ounces of prune or apricot juice each morning.  o Eat two (2) large servings of stewed fruit each day.  o Take one (1) heaping tablespoon of a psyllium-based bulking agent twice a day. Use sugar-free sweetener when possible to avoid excessive calories.  o Eat a normal breakfast.  o Set aside 15 minutes after breakfast to sit on the toilet, but do not strain to have a bowel movement.  o If you do not have a bowel movement by the third day, use an enema and repeat the  above steps.   

## 2012-08-08 NOTE — Progress Notes (Signed)
Chief Complaint  Patient presents with  . Hemorrhoids    HISTORY: Mindy Grant is a 57 y.o. female who presents to the office with rectal pain after hemorrhoidectomy in Dec.  She underwent a 2 column hemorrhoidectomy then.  Other symptoms include difficulty having normal BM's.  She states that she feels a bulge a left side while having a BM.  She has to pull her left buttocks over to "straighten ou her anal canal".  She describes a burning sensation with BM's.    BM's makes the symptoms worse.  Laxatives just give her diarrhea. It is continuous in nature.  Her bowel habits are regular and her bowel movements are sometimes hard.  Her fiber intake is minimal.  Her last colonoscopy was about 42yrs ago.  She had a polyp removed and is due in 2 yrs.       Past Medical History  Diagnosis Date  . Thyroid disease   . Complication of anesthesia     takes along time to wake up  . Multiple allergies   . Deviated nasal septum   . Fibromyalgia   . Asthma     as a child-no inhalers now  . Anxiety   . Depression   . GERD (gastroesophageal reflux disease)   . Insomnia       Past Surgical History  Procedure Laterality Date  . Cholecystectomy  01/2011  . Abdominal hysterectomy  1996  . Tonsillectomy    . Breast ductal system excision  10/06/2011    Procedure: EXCISION DUCTAL SYSTEM BREAST;  Surgeon: Emelia Loron, MD;  Location: [facility];  Service: General;  Laterality: Left;  . Excisional hemorrhoidectomy          Current Outpatient Prescriptions  Medication Sig Dispense Refill  . ALPRAZolam (XANAX) 0.5 MG tablet Take 0.5 mg by mouth at bedtime as needed.      Marland Kitchen buPROPion (WELLBUTRIN XL) 150 MG 24 hr tablet       . butalbital-acetaminophen-caffeine (FIORICET, ESGIC) 50-325-40 MG per tablet Take 1 tablet by mouth 2 (two) times daily as needed.      . fish oil-omega-3 fatty acids 1000 MG capsule Take 2 g by mouth daily.      Marland Kitchen levothyroxine (SYNTHROID, LEVOTHROID) 75 MCG tablet  112 mcg.       . meloxicam (MOBIC) 15 MG tablet       . Multiple Vitamins-Minerals (MULTIVITAMIN WITH MINERALS) tablet Take 1 tablet by mouth daily.      Marland Kitchen omeprazole (PRILOSEC) 40 MG capsule       . oxyCODONE-acetaminophen (PERCOCET) 10-325 MG per tablet Take 1 tablet by mouth every 6 (six) hours as needed for pain.  20 tablet  0  . SUMAtriptan (IMITREX) 100 MG tablet 50 mg as needed.       Marland Kitchen VIVELLE-DOT 0.1 MG/24HR        No current facility-administered medications for this visit.      Allergies  Allergen Reactions  . Codeine Itching      Family History  Problem Relation Age of Onset  . Cancer Mother     lung    History   Social History  . Marital Status: Divorced    Spouse Name: N/A    Number of Children: N/A  . Years of Education: N/A   Social History Main Topics  . Smoking status: Never Smoker   . Smokeless tobacco: None  . Alcohol Use: No  . Drug Use: No  . Sexually Active:  None   Other Topics Concern  . None   Social History Narrative  . None      REVIEW OF SYSTEMS - PERTINENT POSITIVES ONLY: Review of Systems - General ROS: negative for - chills, fever or weight loss Hematological and Lymphatic ROS: negative for - bleeding problems, blood clots or bruising Respiratory ROS: no cough, shortness of breath, or wheezing Cardiovascular ROS: no chest pain or dyspnea on exertion Gastrointestinal ROS: positive for - change in bowel habits and gas/bloating negative for - blood in stools, nausea/vomiting or stool incontinence Genito-Urinary ROS: no dysuria, trouble voiding, or hematuria  EXAM: Filed Vitals:   08/08/12 0938  BP: 122/80  Pulse: 78  Temp: 97 F (36.1 C)  Resp: 18    General appearance: alert and cooperative Resp: clear to auscultation bilaterally Cardio: regular rate and rhythm GI: soft, non-tender; bowel sounds normal; no masses,  no organomegaly   Procedure: Anoscopy Surgeon: Maisie Fus Diagnosis: rectal pain  Assistant: Christella Scheuermann After  the risks and benefits were explained, verbal consent was obtained for above procedure  Anesthesia: none Findings: good squeeze, moderate rectocele, very lax levators bilaterally, no palpable masses, no mucosal abnormalities noted    ASSESSMENT AND PLAN: Mindy Grant is a 57 y.o. F with pain and difficulty with defecation after a standard 2 column hemorrhoidectomy.  On exam she has very lax levator muscles which may be the cause of her defecation problems.  I think a MR defecography may help determine the exact issue.  I have also asked her to work on her fiber intake, as I think this may help improve some of her defecation symptoms.    Vanita Panda, MD Colon and Rectal Surgery / General Surgery [facility]      Visit Diagnoses: 1. Abnormal defecation   2. Painful defecation     Primary Care Physician: Bernita Raisin., MD

## 2012-08-10 ENCOUNTER — Ambulatory Visit
Admission: RE | Admit: 2012-08-10 | Discharge: 2012-08-10 | Disposition: A | Discharge status: Home / Self Care | Payer: BC Managed Care – PPO | Source: Ambulatory Visit | Attending: General Surgery | Admitting: General Surgery

## 2012-08-10 DIAGNOSIS — R198 Other specified symptoms and signs involving the digestive system and abdomen: Secondary | ICD-10-CM

## 2012-08-18 ENCOUNTER — Telehealth (INDEPENDENT_AMBULATORY_CARE_PROVIDER_SITE_OTHER): Payer: Self-pay | Admitting: General Surgery

## 2012-08-18 NOTE — Telephone Encounter (Signed)
I called her this morning to discuss her pelvic MRI.  Given her lack of major symptoms, we will continue with conservative fiber therapy for now.  I will see her back in 2 months.  She denies any major urinary symptoms at this time.  She just has some incomplete emptying occasionally.

## 2012-08-18 NOTE — Telephone Encounter (Signed)
I called the pt and scheduled a follow up for July.  She feels it may be a waste of time but she will call and cancel later if she decides to.

## 2012-08-18 NOTE — Telephone Encounter (Signed)
We discussed the results of her MRI.  She has global pelvic dysfunction.

## 2012-10-03 ENCOUNTER — Other Ambulatory Visit (INDEPENDENT_AMBULATORY_CARE_PROVIDER_SITE_OTHER): Payer: Self-pay | Admitting: General Surgery

## 2012-10-03 DIAGNOSIS — Z1231 Encounter for screening mammogram for malignant neoplasm of breast: Secondary | ICD-10-CM

## 2012-10-18 ENCOUNTER — Ambulatory Visit (INDEPENDENT_AMBULATORY_CARE_PROVIDER_SITE_OTHER): Payer: BC Managed Care – PPO | Admitting: General Surgery

## 2012-10-23 ENCOUNTER — Ambulatory Visit (INDEPENDENT_AMBULATORY_CARE_PROVIDER_SITE_OTHER): Payer: BC Managed Care – PPO | Admitting: General Surgery

## 2012-10-26 ENCOUNTER — Ambulatory Visit (HOSPITAL_COMMUNITY): Payer: BC Managed Care – PPO

## 2012-11-14 ENCOUNTER — Ambulatory Visit (HOSPITAL_COMMUNITY): Payer: BC Managed Care – PPO

## 2012-11-14 ENCOUNTER — Other Ambulatory Visit (INDEPENDENT_AMBULATORY_CARE_PROVIDER_SITE_OTHER): Payer: Self-pay | Admitting: General Surgery

## 2012-11-14 ENCOUNTER — Ambulatory Visit (HOSPITAL_COMMUNITY)
Admission: RE | Admit: 2012-11-14 | Discharge: 2012-11-14 | Disposition: A | Discharge status: Home / Self Care | Payer: BC Managed Care – PPO | Source: Ambulatory Visit | Attending: General Surgery | Admitting: General Surgery

## 2012-11-14 DIAGNOSIS — Z1231 Encounter for screening mammogram for malignant neoplasm of breast: Secondary | ICD-10-CM

## 2012-11-15 ENCOUNTER — Other Ambulatory Visit: Payer: Self-pay | Admitting: Obstetrics and Gynecology

## 2012-11-15 DIAGNOSIS — N644 Mastodynia: Secondary | ICD-10-CM

## 2012-11-30 ENCOUNTER — Ambulatory Visit (INDEPENDENT_AMBULATORY_CARE_PROVIDER_SITE_OTHER): Payer: BC Managed Care – PPO | Admitting: General Surgery

## 2012-11-30 ENCOUNTER — Encounter (INDEPENDENT_AMBULATORY_CARE_PROVIDER_SITE_OTHER): Payer: Self-pay | Admitting: General Surgery

## 2012-11-30 VITALS — BP 118/78 | HR 72 | Temp 97.0°F | Resp 12 | Ht 68.0 in | Wt 151.0 lb

## 2012-11-30 DIAGNOSIS — N816 Rectocele: Secondary | ICD-10-CM

## 2012-11-30 NOTE — Progress Notes (Signed)
Oanh Devivo is a 57 y.o. female who is here for a follow up visit regarding her pelvic floor dysfunction.  She reports no change in her BM's on fiber.  She still feels a bulge laterally when she is having a BM.  On MRI defecography she has a large rectocele anteriorly and global pelvic floor dysfunction of all 3 compartments.     Objective: Filed Vitals:   11/30/12 1706  BP: 118/78  Pulse: 72  Temp: 97 F (36.1 C)  Resp: 12    General appearance: alert and cooperative   Assessment and Plan: We had a long discussion today regarding her condition.  I explained to her that we could fix her rectocele but she is not complaining of a typical symptoms of rectocele and therefore am not sure if it would make a big difference. She is having some urinary symptoms and urgency. I offered to send her to a urologist for evaluation of her anterior compartment problems. I also offered to arrange a second opinion in a university setting he may be able to offer her more options. She elected to undergo referral. I will refer her to [facility] colorectal surgery for further evaluation.  I have given her a copy of her MRI report.    Vanita Panda, MD [facility], [place] [phone]

## 2012-11-30 NOTE — Patient Instructions (Signed)
We will refer you to [facility] Colorectal surgery department for evaluation of your pelvic floor problems.

## 2012-12-04 ENCOUNTER — Telehealth (INDEPENDENT_AMBULATORY_CARE_PROVIDER_SITE_OTHER): Payer: Self-pay

## 2012-12-04 NOTE — Telephone Encounter (Signed)
I called and spoke to the pt.  I told her she was referred to the colon/ rectal surgeon and a urologist will be separate.  She is confused because she states Dr Maisie Fus was sending her to a pelvic floor specialist who could take care of everything.  I told her the MD she will see tomorrow will only take care of the rectal issue.  She is upset that she may have to see 3 doctors.  She has issues with her bladder, vagina, and rectum.  She is displeased with how it has gone so far under our care.  She states she waited too long to be called about her MRI.  She asked if I have talked to Dr Maisie Fus or just telling her what I think.  I told her Dr Maisie Fus is unavailable this week so I am telling her my take on the issue.  I offered her to speak to my manager and she accepts.  I let her speak to Marlowe Aschoff RN.

## 2012-12-04 NOTE — Telephone Encounter (Signed)
Patient states she has been referred to a rectum specialist, she also has bladder problems . She is asking if Dr. Lawerance Bach the correct physician for this  Referral . Her appt is for 12-05-12 please advise

## 2012-12-18 ENCOUNTER — Telehealth (INDEPENDENT_AMBULATORY_CARE_PROVIDER_SITE_OTHER): Payer: Self-pay | Admitting: *Deleted

## 2012-12-18 NOTE — Telephone Encounter (Signed)
Patient called talking about appts she had scheduled but then one getting cancelled and rescheduled with another doctor so notes needing to be sent there.  I saw Huntley Dec RN's note and this likes some what complicated.  Explained to patient that a message would be sent to Huntley Dec to ask her to call patient to discuss so that Huntley Dec can understand what is going on and the changes with appts that have been made.  Patient states understanding and agreeable at this time.

## 2012-12-18 NOTE — Telephone Encounter (Signed)
I called and spoke to the pt and she states Dr Cherre Blanc cancelled her appointment and scheduled her to see Dr Leonides Sake Visco.  He is a pelvic floor specialist.  She was not told why her appointment was cancelled.  I told her Dr Cherre Blanc may have read Dr Maisie Fus' notes and realized this wasn't something she could treat.  The pt states Dr Nigel Sloop needs her notes.  She does not have his information on hand.  I told her I will call Dr Cherre Blanc and get the referral info.  Her appointment is not until 10/29 so I will also ask if she can get seen sooner.

## 2012-12-19 ENCOUNTER — Other Ambulatory Visit: Payer: Self-pay | Admitting: Obstetrics and Gynecology

## 2012-12-19 ENCOUNTER — Ambulatory Visit
Admission: RE | Admit: 2012-12-19 | Discharge: 2012-12-19 | Disposition: A | Discharge status: Home / Self Care | Payer: BC Managed Care – PPO | Source: Ambulatory Visit | Attending: Obstetrics and Gynecology | Admitting: Obstetrics and Gynecology

## 2012-12-19 DIAGNOSIS — N644 Mastodynia: Secondary | ICD-10-CM

## 2012-12-29 ENCOUNTER — Ambulatory Visit
Admission: RE | Admit: 2012-12-29 | Discharge: 2012-12-29 | Disposition: A | Discharge status: Home / Self Care | Payer: BC Managed Care – PPO | Source: Ambulatory Visit | Attending: Obstetrics and Gynecology | Admitting: Obstetrics and Gynecology

## 2012-12-29 DIAGNOSIS — N644 Mastodynia: Secondary | ICD-10-CM

## 2013-01-24 DIAGNOSIS — R32 Unspecified urinary incontinence: Secondary | ICD-10-CM | POA: Insufficient documentation

## 2013-01-24 DIAGNOSIS — N993 Prolapse of vaginal vault after hysterectomy: Secondary | ICD-10-CM | POA: Insufficient documentation

## 2013-02-09 ENCOUNTER — Other Ambulatory Visit: Payer: Self-pay | Admitting: Diagnostic Radiology

## 2013-03-13 DIAGNOSIS — E039 Hypothyroidism, unspecified: Secondary | ICD-10-CM | POA: Insufficient documentation

## 2013-11-15 ENCOUNTER — Other Ambulatory Visit (INDEPENDENT_AMBULATORY_CARE_PROVIDER_SITE_OTHER): Payer: Self-pay | Admitting: General Surgery

## 2013-11-15 ENCOUNTER — Other Ambulatory Visit: Payer: Self-pay | Admitting: Obstetrics and Gynecology

## 2013-11-15 DIAGNOSIS — N644 Mastodynia: Secondary | ICD-10-CM

## 2013-11-22 ENCOUNTER — Encounter (INDEPENDENT_AMBULATORY_CARE_PROVIDER_SITE_OTHER): Payer: BC Managed Care – PPO | Admitting: General Surgery

## 2013-11-25 ENCOUNTER — Encounter: Payer: Self-pay | Admitting: Emergency Medicine

## 2013-11-25 ENCOUNTER — Emergency Department
Admission: EM | Admit: 2013-11-25 | Discharge: 2013-11-25 | Disposition: A | Discharge status: Home / Self Care | Payer: BC Managed Care – PPO | Source: Home / Self Care | Attending: Family Medicine | Admitting: Family Medicine

## 2013-11-25 DIAGNOSIS — G43401 Hemiplegic migraine, not intractable, with status migrainosus: Secondary | ICD-10-CM

## 2013-11-25 DIAGNOSIS — G43019 Migraine without aura, intractable, without status migrainosus: Secondary | ICD-10-CM

## 2013-11-25 MED ORDER — SUMATRIPTAN SUCCINATE 25 MG PO TABS: ORAL_TABLET | ORAL | Status: AC

## 2013-11-25 MED ORDER — PROMETHAZINE HCL 12.5 MG RE SUPP: 12.5000 mg | Freq: Four times a day (QID) | RECTAL | Status: DC | PRN

## 2013-11-25 MED ORDER — PROMETHAZINE HCL 25 MG/ML IJ SOLN
25.0000 mg | Freq: Once | INTRAMUSCULAR | Status: AC
  Administered 2013-11-25: 25 mg via INTRAMUSCULAR

## 2013-11-25 MED ORDER — KETOROLAC TROMETHAMINE 60 MG/2ML IM SOLN
60.0000 mg | Freq: Once | INTRAMUSCULAR | Status: AC
  Administered 2013-11-25: 60 mg via INTRAMUSCULAR

## 2013-11-25 MED ORDER — ISOMETHEPTENE-APAP-DICHLORAL 65-325-100 MG PO CAPS: ORAL_CAPSULE | ORAL | Status: DC

## 2013-11-25 NOTE — Discharge Instructions (Signed)
May continue oral ketorolac every 8 hours as prescribed. If symptoms become significantly worse during the night or over the weekend, proceed to the local emergency room.    Recurrent Migraine Headache A migraine headache is an intense, throbbing pain on one or both sides of your head. Recurrent migraines keep coming back. A migraine can last for 30 minutes to several hours. CAUSES  The exact cause of a migraine headache is not always known. However, a migraine may be caused when nerves in the brain become irritated and release chemicals that cause inflammation. This causes pain. Certain things may also trigger migraines, such as:   Alcohol.  Smoking.  Stress.  Menstruation.  Aged cheeses.  Foods or drinks that contain nitrates, glutamate, aspartame, or tyramine.  Lack of sleep.  Chocolate.  Caffeine.  Hunger.  Physical exertion.  Fatigue.  Medicines used to treat chest pain (nitroglycerine), birth control pills, estrogen, and some blood pressure medicines. SYMPTOMS   Pain on one or both sides of your head.  Pulsating or throbbing pain.  Severe pain that prevents daily activities.  Pain that is aggravated by any physical activity.  Nausea, vomiting, or both.  Dizziness.  Pain with exposure to bright lights, loud noises, or activity.  General sensitivity to bright lights, loud noises, or smells. Before you get a migraine, you may get warning signs that a migraine is coming (aura). An aura may include:  Seeing flashing lights.  Seeing bright spots, halos, or zigzag lines.  Having tunnel vision or blurred vision.  Having feelings of numbness or tingling.  Having trouble talking.  Having muscle weakness. DIAGNOSIS  A recurrent migraine headache is often diagnosed based on:  Symptoms.  Physical examination.  A CT scan or MRI of your head. These imaging tests cannot diagnose migraines but can help rule out other causes of headaches.  TREATMENT    Medicines may be given for pain and nausea. Medicines can also be given to help prevent recurrent migraines. HOME CARE INSTRUCTIONS  Only take over-the-counter or prescription medicines for pain or discomfort as directed by your health care provider. The use of long-term narcotics is not recommended.  Lie down in a dark, quiet room when you have a migraine.  Keep a journal to find out what may trigger your migraine headaches. For example, write down:  What you eat and drink.  How much sleep you get.  Any change to your diet or medicines.  Limit alcohol consumption.  Quit smoking if you smoke.  Get 7-9 hours of sleep, or as recommended by your health care provider.  Limit stress.  Keep lights dim if bright lights bother you and make your migraines worse. SEEK MEDICAL CARE IF:   You do not get relief from the medicines given to you.  You have a recurrence of pain.  You have a fever. SEEK IMMEDIATE MEDICAL CARE IF:  Your migraine becomes severe.  You have a stiff neck.  You have loss of vision.  You have muscular weakness or loss of muscle control.  You start losing your balance or have trouble walking.  You feel faint or pass out.  You have severe symptoms that are different from your first symptoms. MAKE SURE YOU:   Understand these instructions.  Will watch your condition.  Will get help right away if you are not doing well or get worse. Document Released: 12/08/2000 Document Revised: 07/30/2013 Document Reviewed: 11/20/2012 [facility] Patient Information 2015 [place], [place]. This information is not intended to replace  advice given to you by your health care provider. Make sure you discuss any questions you have with your health care provider.

## 2013-11-25 NOTE — ED Provider Notes (Signed)
CSN: [id number]     Arrival date & time 11/25/13  1306 History   First MD Initiated Contact with Patient 11/25/13 1415     Chief Complaint  Patient presents with  . Headache  . Diverticulitis      HPI Comments: Patient started treatment for diverticulitis three days ago with Augmentin.  She developed a migraine headache two days ago, and vomited yesterday evening.  She has used Imitrex in the past with success, but no longer has any.  Patient is a 58 y.o. female presenting with migraines. The history is provided by the patient.  Migraine This is a recurrent problem. The current episode started 2 days ago. The problem occurs constantly. The problem has not changed since onset.Exacerbated by: exposure to light. Nothing relieves the symptoms. She has tried nothing for the symptoms.    Past Medical History  Diagnosis Date  . Thyroid disease   . Complication of anesthesia     takes along time to wake up  . Multiple allergies   . Deviated nasal septum   . Fibromyalgia   . Asthma     as a child-no inhalers now  . Anxiety   . Depression   . GERD (gastroesophageal reflux disease)   . Insomnia    Past Surgical History  Procedure Laterality Date  . Cholecystectomy  01/2011  . Abdominal hysterectomy  1996  . Tonsillectomy    . Breast ductal system excision  10/06/2011    Procedure: EXCISION DUCTAL SYSTEM BREAST;  Surgeon: Rolm Bookbinder, MD;  Location: [facility];  Service: General;  Laterality: Left;  . Excisional hemorrhoidectomy     Family History  Problem Relation Age of Onset  . Cancer Mother     lung   History  Substance Use Topics  . Smoking status: Never Smoker   . Smokeless tobacco: Not on file  . Alcohol Use: No   OB History   Grav Para Term Preterm Abortions TAB SAB Ect Mult Living                 Review of Systems  Neurological: Negative for dizziness, tremors, syncope, facial asymmetry, speech difficulty, weakness, light-headedness and  numbness.  All other systems reviewed and are negative.   Allergies  Codeine  Home Medications   Prior to Admission medications   Medication Sig Start Date End Date Taking? Authorizing Provider  estradiol (CLIMARA) 0.06 MG/24HR Place 1 patch onto the skin once a week.   Yes Historical Provider, MD  fish oil-omega-3 fatty acids 1000 MG capsule Take 2 g by mouth daily.   Yes Historical Provider, MD  levothyroxine (SYNTHROID, LEVOTHROID) 75 MCG tablet 112 mcg.  08/13/11  Yes Historical Provider, MD  Multiple Vitamins-Minerals (MULTIVITAMIN WITH MINERALS) tablet Take 1 tablet by mouth daily.   Yes Historical Provider, MD  ALPRAZolam Duanne Moron) 0.5 MG tablet Take 0.5 mg by mouth at bedtime as needed.    Historical Provider, MD  buPROPion (WELLBUTRIN XL) 150 MG 24 hr tablet  09/03/11   Historical Provider, MD  butalbital-acetaminophen-caffeine (FIORICET, ESGIC) 50-325-40 MG per tablet Take 1 tablet by mouth 2 (two) times daily as needed.    Historical Provider, MD  isometheptene-acetaminophen-dichloralphenazone (MIDRIN) 65-325-100 MG capsule For migraine headache, take two caps by mouth immediately, then one each hour until headache improves.  Maximum 5 capsules in 12 hours for migraine headaches 11/25/13   Kandra Nicolas, MD  meloxicam [facility]) 15 MG tablet  09/19/11   Historical Provider, MD  omeprazole (PRILOSEC) 40 MG capsule  09/19/11   Historical Provider, MD  promethazine (PHENERGAN) 12.5 MG suppository Place 1 suppository (12.5 mg total) rectally every 6 (six) hours as needed for nausea or vomiting. 11/25/13   Kandra Nicolas, MD  SUMAtriptan (IMITREX) 25 MG tablet Take one tab by mouth at onset of headache.  May repeat dose after two hours. 11/25/13   Kandra Nicolas, MD  VIVELLE-DOT 0.1 MG/24HR  07/01/11   Historical Provider, MD   BP 119/81  Pulse 88  Temp(Src) 98.2 F (36.8 C) (Oral)  Ht 5\' 7"  (1.702 m)  Wt 151 lb (68.493 kg)  BMI 23.64 kg/m2  SpO2 97% Physical Exam Nursing notes and Vital  Signs reviewed. Appearance:  Patient appears healthy, stated age, and in no acute distress. Eyes:  Pupils are equal, round, and reactive to light and accomodation.  Extraocular movement is intact.  Conjunctivae are not inflamed.  Fundi benign.  She has mild photophobia.  Ears:  Canals normal.  Tympanic membranes normal.  Nose:   Normal turbinates.  No sinus tenderness.   Pharynx:  Normal Neck:  Supple.  No adenopathy Lungs:  Clear to auscultation.  Breath sounds are equal.  Heart:  Regular rate and rhythm without murmurs, rubs, or gallops.  Abdomen:  Nontender without masses or hepatosplenomegaly.  Bowel sounds are present.  No CVA or flank tenderness.  Extremities:  No edema.  No calf tenderness Skin:  No rash present.  Neurologic:  Cranial nerves 2 through 12 are normal.  Patellar, achilles, and elbow reflexes are normal.  Cerebellar function is intact (finger-to-nose and rapid alternating hand movement).  Gait and station are normal.     ED Course  Procedures  none   MDM   1. Intractable migraine without aura and without status migrainosus    Toradol 60mg  IM and Phenergan 25mg  IM. Rx for Phenergan rectal suppositories.  Rx for Midrin.  Rx for Imitrex for future headaches. May continue oral ketorolac every 8 hours as prescribed. If symptoms become significantly worse during the night or over the weekend, proceed to the local emergency room.  Followup with Family Doctor if not improved in 3 days.   Kandra Nicolas, MD 11/30/13 1946

## 2013-11-25 NOTE — ED Notes (Signed)
Mindy Grant complains of a migraine for 2 days and vomiting late yesterday evening. She states the pain is a 10/10 on the pain scale. She was diagnosed on Thursday with diverticulosis and has been treated with Augmentin.

## 2013-12-21 ENCOUNTER — Other Ambulatory Visit (INDEPENDENT_AMBULATORY_CARE_PROVIDER_SITE_OTHER): Payer: Self-pay | Admitting: General Surgery

## 2013-12-21 ENCOUNTER — Ambulatory Visit
Admission: RE | Admit: 2013-12-21 | Discharge: 2013-12-21 | Disposition: A | Discharge status: Home / Self Care | Payer: BC Managed Care – PPO | Source: Ambulatory Visit | Attending: General Surgery | Admitting: General Surgery

## 2013-12-21 DIAGNOSIS — N644 Mastodynia: Secondary | ICD-10-CM

## 2014-01-08 DIAGNOSIS — Z789 Other specified health status: Secondary | ICD-10-CM | POA: Insufficient documentation

## 2014-01-15 HISTORY — PX: COLON SURGERY: SHX602

## 2014-03-29 HISTORY — PX: HERNIA REPAIR: SHX51

## 2014-04-10 ENCOUNTER — Other Ambulatory Visit: Payer: Self-pay | Admitting: *Deleted

## 2014-04-10 DIAGNOSIS — I8393 Asymptomatic varicose veins of bilateral lower extremities: Secondary | ICD-10-CM

## 2014-06-03 ENCOUNTER — Encounter: Payer: Self-pay | Admitting: Vascular Surgery

## 2014-06-04 ENCOUNTER — Ambulatory Visit (HOSPITAL_COMMUNITY)
Admission: RE | Admit: 2014-06-04 | Discharge: 2014-06-04 | Disposition: A | Discharge status: Home / Self Care | Payer: BLUE CROSS/BLUE SHIELD | Source: Ambulatory Visit | Attending: Vascular Surgery | Admitting: Vascular Surgery

## 2014-06-04 ENCOUNTER — Encounter: Payer: Self-pay | Admitting: Vascular Surgery

## 2014-06-04 ENCOUNTER — Ambulatory Visit (INDEPENDENT_AMBULATORY_CARE_PROVIDER_SITE_OTHER): Payer: BLUE CROSS/BLUE SHIELD | Admitting: Vascular Surgery

## 2014-06-04 VITALS — BP 120/73 | HR 64 | Resp 16 | Ht 68.0 in | Wt 146.0 lb

## 2014-06-04 DIAGNOSIS — M79605 Pain in left leg: Secondary | ICD-10-CM

## 2014-06-04 DIAGNOSIS — I8393 Asymptomatic varicose veins of bilateral lower extremities: Secondary | ICD-10-CM | POA: Diagnosis present

## 2014-06-04 DIAGNOSIS — M79602 Pain in left arm: Secondary | ICD-10-CM

## 2014-06-04 DIAGNOSIS — I8390 Asymptomatic varicose veins of unspecified lower extremity: Secondary | ICD-10-CM | POA: Insufficient documentation

## 2014-06-04 DIAGNOSIS — I83812 Varicose veins of left lower extremities with pain: Secondary | ICD-10-CM | POA: Insufficient documentation

## 2014-06-04 DIAGNOSIS — M79606 Pain in leg, unspecified: Secondary | ICD-10-CM | POA: Insufficient documentation

## 2014-06-04 NOTE — Progress Notes (Signed)
Subjective:     Patient ID: Mindy Grant, female   DOB: Apr 27, 1955, 59 y.o.   MRN: [id number]  HPI this 59 year old female is referred for evaluation of left leg pain which is located in the postero-lateral aspect of her leg below the knee. It usually begins in the area of the lateral malleolus and extends proximally. She describes it as burning and throbbing in nature. It occurs at day time or night. There is nothing specific to relieve the symptom. She has no history of traumatic injury to this area. She has no history of DVT thrombophlebitis stasis ulcers or bleeding. She does see a chiropractor for back discomfort. Insulin present for about one year.  Past Medical History  Diagnosis Date  . Thyroid disease   . Complication of anesthesia     takes along time to wake up  . Multiple allergies   . Deviated nasal septum   . Fibromyalgia   . Asthma     as a child-no inhalers now  . Anxiety   . Depression   . GERD (gastroesophageal reflux disease)   . Insomnia     History  Substance Use Topics  . Smoking status: Never Smoker   . Smokeless tobacco: Not on file  . Alcohol Use: No    Family History  Problem Relation Age of Onset  . Cancer Mother     lung    Allergies  Allergen Reactions  . Codeine Itching     Current outpatient prescriptions:  .  clorazepate (TRANXENE) 3.75 MG tablet, Take 3.75 mg by mouth 2 (two) times daily as needed for anxiety., Disp: , Rfl:  .  estradiol (ESTRACE) 2 MG tablet, Take 2 mg by mouth daily., Disp: , Rfl:  .  Multiple Vitamins-Minerals (MULTIVITAMIN WITH MINERALS) tablet, Take 1 tablet by mouth daily., Disp: , Rfl:  .  SUMAtriptan (IMITREX) 25 MG tablet, Take one tab by mouth at onset of headache.  May repeat dose after two hours., Disp: 10 tablet, Rfl: 1 .  Thyroid 81.25 MG TABS, Take by mouth., Disp: , Rfl:  .  ALPRAZolam (XANAX) 0.5 MG tablet, Take 0.5 mg by mouth at bedtime as needed., Disp: , Rfl:  .  buPROPion (WELLBUTRIN XL) 150 MG 24 hr  tablet, , Disp: , Rfl:  .  butalbital-acetaminophen-caffeine (FIORICET, ESGIC) 50-325-40 MG per tablet, Take 1 tablet by mouth 2 (two) times daily as needed., Disp: , Rfl:  .  estradiol (CLIMARA) 0.06 MG/24HR, Place 1 patch onto the skin once a week., Disp: , Rfl:  .  fish oil-omega-3 fatty acids 1000 MG capsule, Take 2 g by mouth daily., Disp: , Rfl:  .  isometheptene-acetaminophen-dichloralphenazone (MIDRIN) 65-325-100 MG capsule, For migraine headache, take two caps by mouth immediately, then one each hour until headache improves.  Maximum 5 capsules in 12 hours for migraine headaches (Patient not taking: Reported on 06/04/2014), Disp: 15 capsule, Rfl: 0 .  levothyroxine (SYNTHROID, LEVOTHROID) 75 MCG tablet, 112 mcg. , Disp: , Rfl:  .  meloxicam (MOBIC) 15 MG tablet, , Disp: , Rfl:  .  omeprazole (PRILOSEC) 40 MG capsule, , Disp: , Rfl:  .  promethazine (PHENERGAN) 12.5 MG suppository, Place 1 suppository (12.5 mg total) rectally every 6 (six) hours as needed for nausea or vomiting. (Patient not taking: Reported on 06/04/2014), Disp: 12 each, Rfl: 0 .  VIVELLE-DOT 0.1 MG/24HR, , Disp: , Rfl:   BP 120/73 mmHg  Pulse 64  Resp 16  Ht 5\' 8"  (1.727 m)  Wt 146 lb (66.225 kg)  BMI 22.20 kg/m2  Body mass index is 22.2 kg/(m^2).         Review of Systems patient does complain of   leg pain with walking, fibromyalgia, lower back discomfort, denies chest pain or dyspnea on exertion. Objective:   Physical Exam BP 120/73 mmHg  Pulse 64  Resp 16  Ht 5\' 8"  (1.727 m)  Wt 146 lb (66.225 kg)  BMI 22.20 kg/m2  Gen.-alert and oriented x3 in no apparent distress HEENT normal for age Lungs no rhonchi or wheezing Cardiovascular regular rhythm no murmurs carotid pulses 3+ palpable no bruits audible Abdomen soft nontender no palpable masses Musculoskeletal free of  major deformities Skin clear -no rashes Neurologic normal Lower extremities 3+ femoral and dorsalis pedis pulses palpable bilaterally  with no edema No evidence of significant varicosities or reticular veins in left leg laterally and posteriorly were discomfort is located. No edema area no inflammation.  Today I ordered a venous duplex exam the left leg which I reviewed and interpreted. There is no DVT. There is no reflux in the superficial or venous systems in the left leg.       Assessment:     Left leg pain laterally and posteriorly located with no evidence of arterial or venous insufficiency to explain her discomfort    Plan:     Mild analgesics and lower compression stockings 20-30 millimeter gradient since these have improved her symptoms in the past   if she has severe symptoms --consider neurology evaluation

## 2015-02-12 ENCOUNTER — Other Ambulatory Visit: Payer: Self-pay

## 2015-02-12 DIAGNOSIS — Z1231 Encounter for screening mammogram for malignant neoplasm of breast: Secondary | ICD-10-CM

## 2015-04-01 ENCOUNTER — Ambulatory Visit: Payer: BLUE CROSS/BLUE SHIELD

## 2015-04-17 ENCOUNTER — Ambulatory Visit
Admission: RE | Admit: 2015-04-17 | Discharge: 2015-04-17 | Disposition: A | Discharge status: Home / Self Care | Payer: BLUE CROSS/BLUE SHIELD | Source: Ambulatory Visit

## 2015-04-17 DIAGNOSIS — Z1231 Encounter for screening mammogram for malignant neoplasm of breast: Secondary | ICD-10-CM

## 2015-10-28 DIAGNOSIS — R140 Abdominal distension (gaseous): Secondary | ICD-10-CM | POA: Insufficient documentation

## 2015-12-25 DIAGNOSIS — R102 Pelvic and perineal pain: Secondary | ICD-10-CM | POA: Insufficient documentation

## 2016-03-18 DIAGNOSIS — R1031 Right lower quadrant pain: Secondary | ICD-10-CM | POA: Insufficient documentation

## 2016-04-22 ENCOUNTER — Other Ambulatory Visit: Payer: Self-pay | Admitting: Obstetrics and Gynecology

## 2016-04-22 DIAGNOSIS — Z1231 Encounter for screening mammogram for malignant neoplasm of breast: Secondary | ICD-10-CM

## 2016-05-14 ENCOUNTER — Ambulatory Visit: Payer: BLUE CROSS/BLUE SHIELD

## 2016-06-15 ENCOUNTER — Ambulatory Visit
Admission: RE | Admit: 2016-06-15 | Discharge: 2016-06-15 | Disposition: A | Discharge status: Home / Self Care | Payer: BLUE CROSS/BLUE SHIELD | Source: Ambulatory Visit | Attending: Obstetrics and Gynecology | Admitting: Obstetrics and Gynecology

## 2016-06-15 DIAGNOSIS — Z1231 Encounter for screening mammogram for malignant neoplasm of breast: Secondary | ICD-10-CM

## 2016-11-03 DIAGNOSIS — R079 Chest pain, unspecified: Secondary | ICD-10-CM | POA: Insufficient documentation

## 2017-03-29 HISTORY — PX: HAND SURGERY: SHX662

## 2017-05-20 ENCOUNTER — Other Ambulatory Visit: Payer: Self-pay | Admitting: Obstetrics and Gynecology

## 2017-05-20 DIAGNOSIS — Z1231 Encounter for screening mammogram for malignant neoplasm of breast: Secondary | ICD-10-CM

## 2017-06-20 ENCOUNTER — Ambulatory Visit
Admission: RE | Admit: 2017-06-20 | Discharge: 2017-06-20 | Disposition: A | Discharge status: Home / Self Care | Payer: BLUE CROSS/BLUE SHIELD | Source: Ambulatory Visit | Attending: Obstetrics and Gynecology | Admitting: Obstetrics and Gynecology

## 2017-06-20 DIAGNOSIS — Z1231 Encounter for screening mammogram for malignant neoplasm of breast: Secondary | ICD-10-CM

## 2018-03-02 DIAGNOSIS — R2231 Localized swelling, mass and lump, right upper limb: Secondary | ICD-10-CM | POA: Insufficient documentation

## 2018-03-06 DIAGNOSIS — M797 Fibromyalgia: Secondary | ICD-10-CM | POA: Insufficient documentation

## 2018-03-06 DIAGNOSIS — L570 Actinic keratosis: Secondary | ICD-10-CM | POA: Insufficient documentation

## 2018-07-21 DIAGNOSIS — K581 Irritable bowel syndrome with constipation: Secondary | ICD-10-CM | POA: Insufficient documentation

## 2018-10-30 DIAGNOSIS — H47233 Glaucomatous optic atrophy, bilateral: Secondary | ICD-10-CM | POA: Insufficient documentation

## 2018-10-30 DIAGNOSIS — H16143 Punctate keratitis, bilateral: Secondary | ICD-10-CM | POA: Insufficient documentation

## 2018-12-07 DIAGNOSIS — M069 Rheumatoid arthritis, unspecified: Secondary | ICD-10-CM | POA: Insufficient documentation

## 2018-12-07 DIAGNOSIS — F329 Major depressive disorder, single episode, unspecified: Secondary | ICD-10-CM | POA: Insufficient documentation

## 2019-01-13 IMAGING — MG DIGITAL SCREENING BILATERAL MAMMOGRAM WITH TOMO AND CAD
8 series · 9 of 24 positions shown · non-contrast
Comparison: Previous exam(s).

CLINICAL DATA: Screening.

EXAM:
DIGITAL SCREENING BILATERAL MAMMOGRAM WITH TOMO AND CAD

[L CC synth-2D]
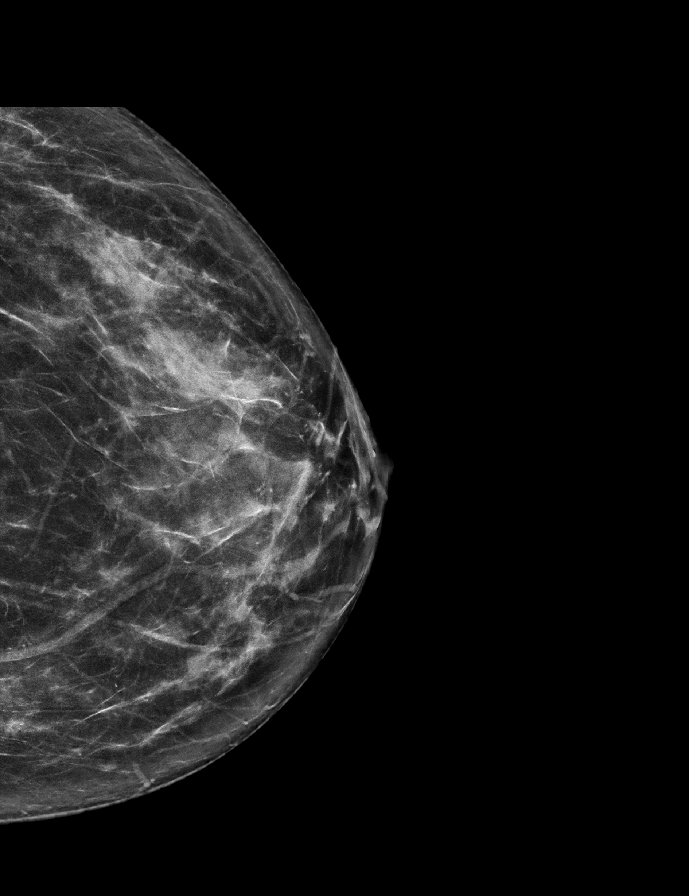

[R CC synth-2D]
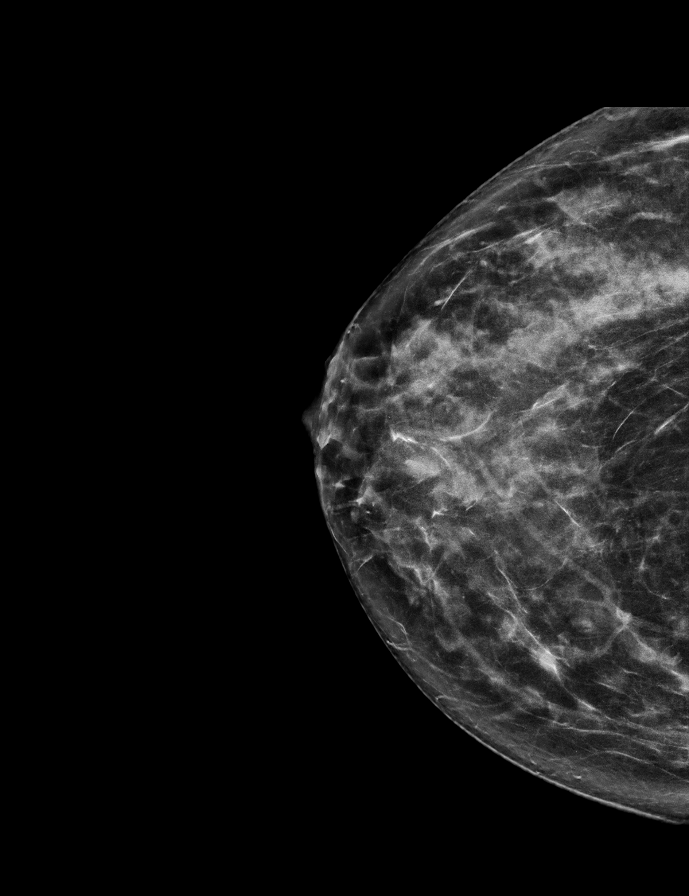

[L MLO synth-2D]
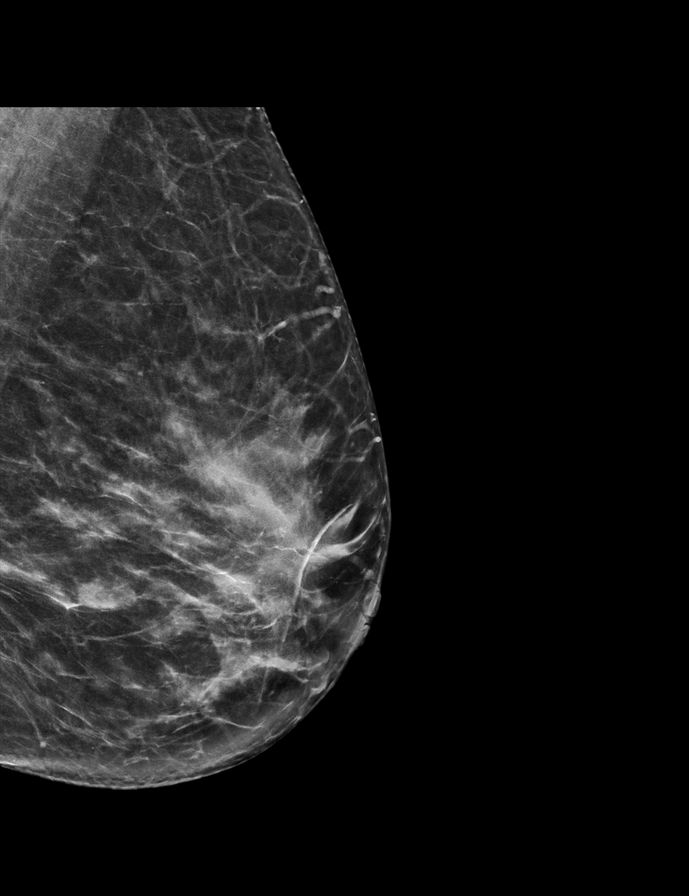

[R MLO synth-2D]
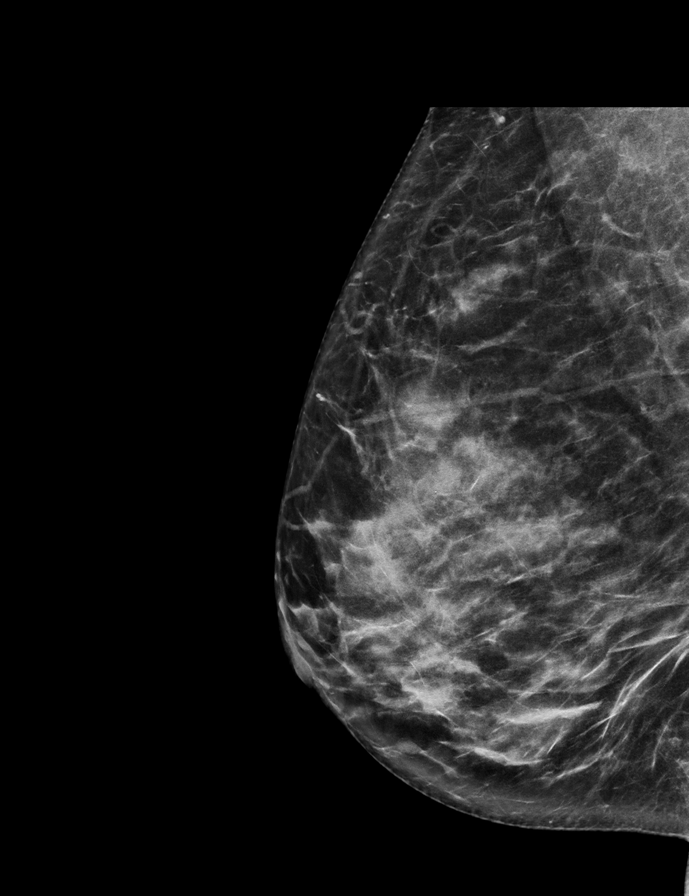

[R MLO tomo · 2 of 69 frames shown]
[frame 23/69]
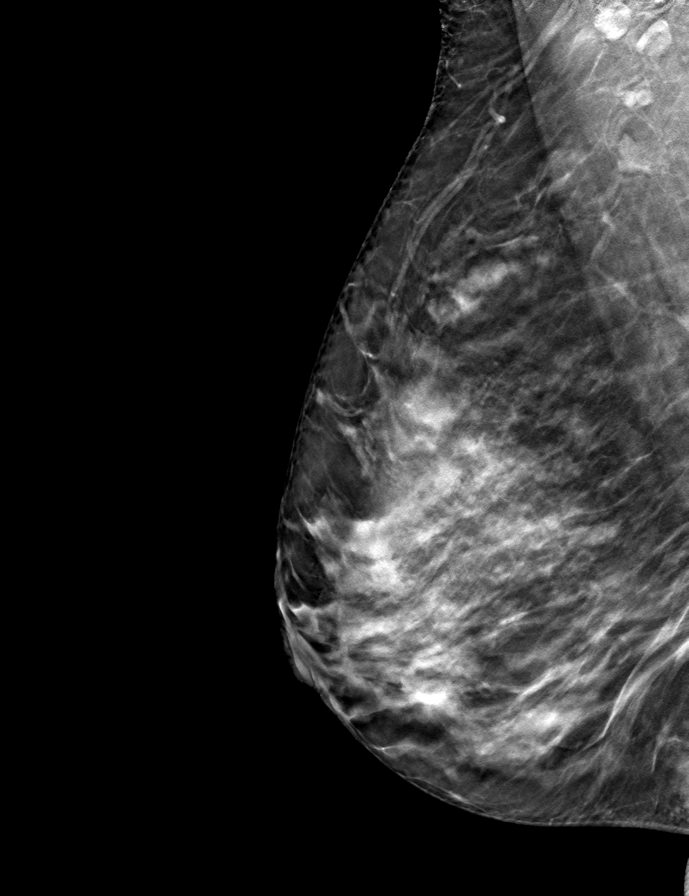
[frame 35/69]
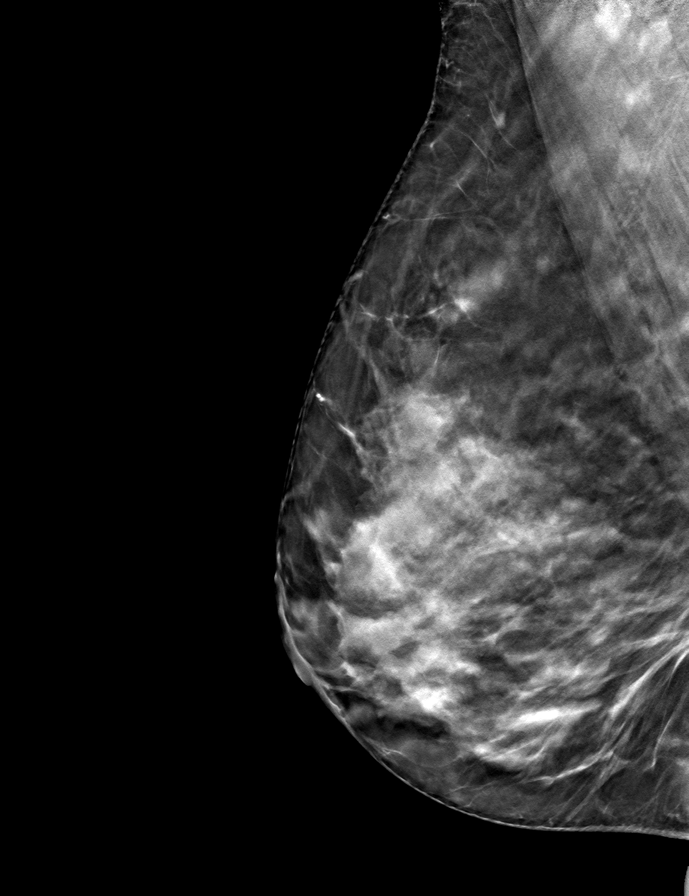

[R CC tomo · tomo slice 33/65.0]
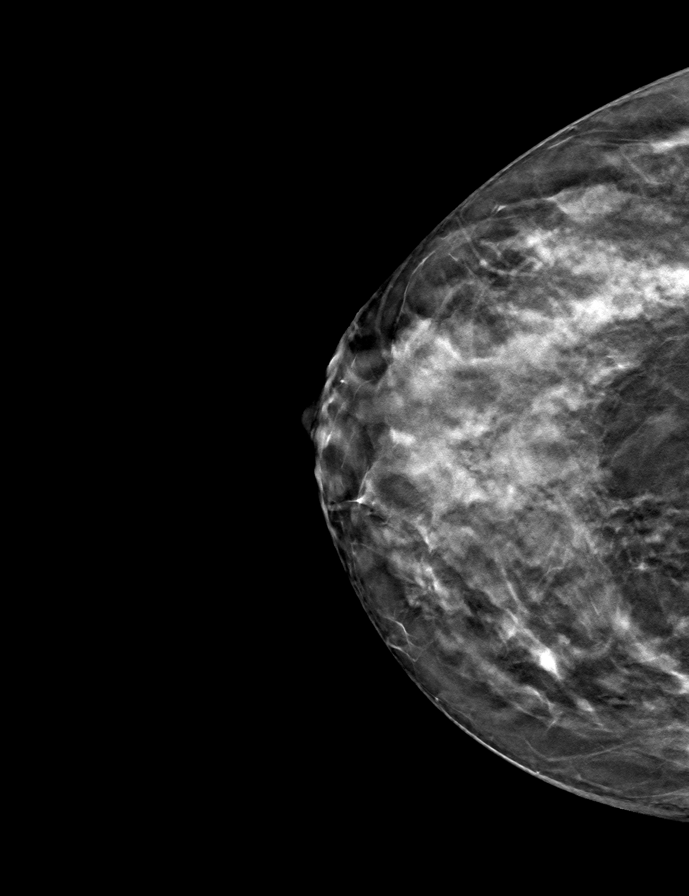

[L CC tomo · tomo slice 34/67.0]
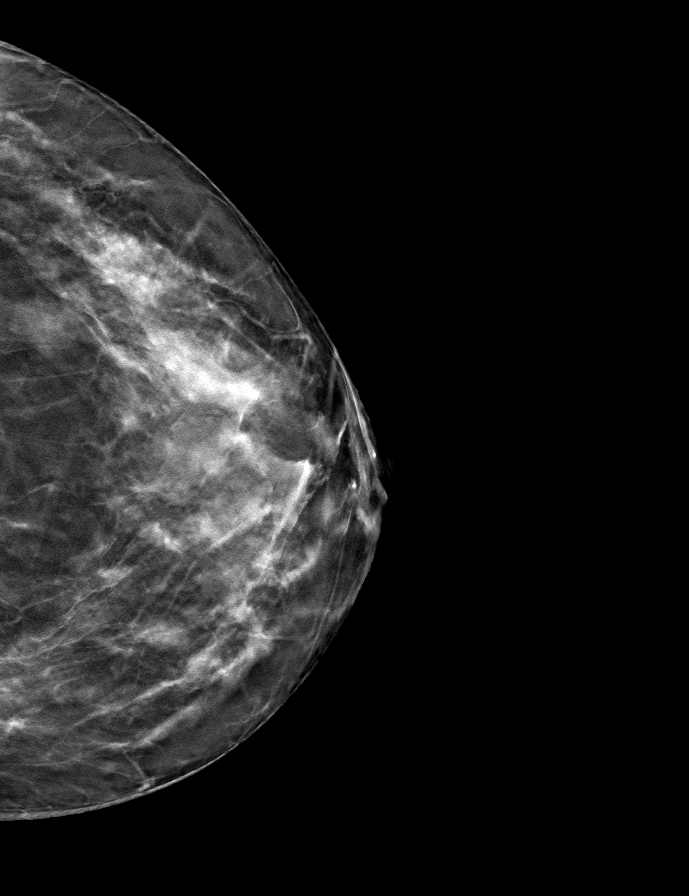

[L MLO tomo · tomo slice 34/67.0]
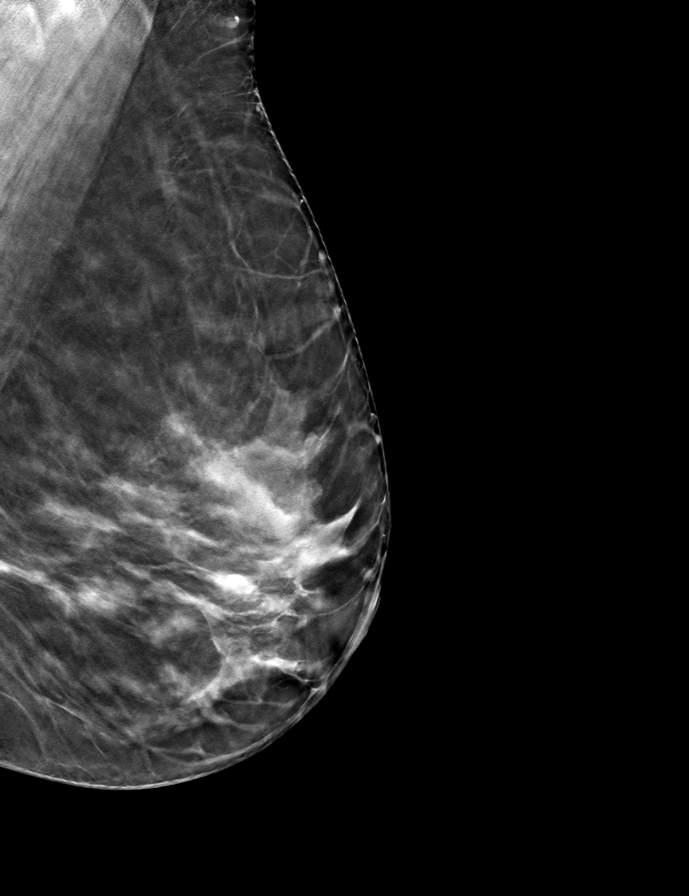

[9 of 24 positions shown; findings below may reference images not displayed]

ACR Breast Density Category c: The breast tissue is heterogeneously
dense, which may obscure small masses.
FINDINGS: There are no findings suspicious for malignancy. Images were
processed with CAD.
IMPRESSION: No mammographic evidence of malignancy. A result letter of this
screening mammogram will be mailed directly to the patient.

RECOMMENDATION:
Screening mammogram in one year. (Code:[id number]-U-LHB)

BI-RADS CATEGORY  1: Negative.

## 2019-03-14 DIAGNOSIS — G5602 Carpal tunnel syndrome, left upper limb: Secondary | ICD-10-CM | POA: Insufficient documentation

## 2019-03-14 DIAGNOSIS — G5622 Lesion of ulnar nerve, left upper limb: Secondary | ICD-10-CM | POA: Insufficient documentation

## 2020-12-11 DIAGNOSIS — M65332 Trigger finger, left middle finger: Secondary | ICD-10-CM | POA: Insufficient documentation

## 2021-01-08 DIAGNOSIS — K219 Gastro-esophageal reflux disease without esophagitis: Secondary | ICD-10-CM | POA: Insufficient documentation

## 2021-01-08 DIAGNOSIS — G43909 Migraine, unspecified, not intractable, without status migrainosus: Secondary | ICD-10-CM | POA: Insufficient documentation

## 2021-02-27 DIAGNOSIS — J45909 Unspecified asthma, uncomplicated: Secondary | ICD-10-CM | POA: Insufficient documentation

## 2022-03-29 HISTORY — PX: CARPAL TUNNEL RELEASE: SHX101

## 2022-11-01 DIAGNOSIS — H179 Unspecified corneal scar and opacity: Secondary | ICD-10-CM | POA: Insufficient documentation

## 2022-11-01 DIAGNOSIS — H40003 Preglaucoma, unspecified, bilateral: Secondary | ICD-10-CM | POA: Insufficient documentation

## 2022-11-01 DIAGNOSIS — H524 Presbyopia: Secondary | ICD-10-CM | POA: Insufficient documentation

## 2022-11-08 DIAGNOSIS — N819 Female genital prolapse, unspecified: Secondary | ICD-10-CM | POA: Insufficient documentation

## 2023-04-23 DIAGNOSIS — M65949 Unspecified synovitis and tenosynovitis, unspecified hand: Secondary | ICD-10-CM | POA: Insufficient documentation

## 2023-05-03 DIAGNOSIS — M8580 Other specified disorders of bone density and structure, unspecified site: Secondary | ICD-10-CM | POA: Insufficient documentation

## 2023-05-12 DIAGNOSIS — R002 Palpitations: Secondary | ICD-10-CM | POA: Insufficient documentation

## 2023-05-12 DIAGNOSIS — R9431 Abnormal electrocardiogram [ECG] [EKG]: Secondary | ICD-10-CM | POA: Insufficient documentation

## 2023-05-12 DIAGNOSIS — R0609 Other forms of dyspnea: Secondary | ICD-10-CM | POA: Insufficient documentation

## 2023-05-12 DIAGNOSIS — R072 Precordial pain: Secondary | ICD-10-CM | POA: Insufficient documentation

## 2023-05-24 DIAGNOSIS — Z79899 Other long term (current) drug therapy: Secondary | ICD-10-CM | POA: Insufficient documentation

## 2023-05-24 DIAGNOSIS — H04123 Dry eye syndrome of bilateral lacrimal glands: Secondary | ICD-10-CM | POA: Insufficient documentation

## 2023-05-24 DIAGNOSIS — H0100A Unspecified blepharitis right eye, upper and lower eyelids: Secondary | ICD-10-CM | POA: Insufficient documentation

## 2023-05-30 DIAGNOSIS — M1610 Unilateral primary osteoarthritis, unspecified hip: Secondary | ICD-10-CM | POA: Insufficient documentation

## 2023-10-07 ENCOUNTER — Ambulatory Visit

## 2023-10-07 ENCOUNTER — Encounter: Payer: Self-pay | Admitting: Internal Medicine

## 2023-10-07 ENCOUNTER — Ambulatory Visit: Payer: BLUE CROSS/BLUE SHIELD | Attending: Internal Medicine | Admitting: Internal Medicine

## 2023-10-07 VITALS — BP 134/75 | HR 61 | Resp 14 | Ht 66.25 in | Wt 135.0 lb

## 2023-10-07 DIAGNOSIS — M797 Fibromyalgia: Secondary | ICD-10-CM

## 2023-10-07 DIAGNOSIS — M0609 Rheumatoid arthritis without rheumatoid factor, multiple sites: Secondary | ICD-10-CM

## 2023-10-07 DIAGNOSIS — M159 Polyosteoarthritis, unspecified: Secondary | ICD-10-CM

## 2023-10-07 DIAGNOSIS — Z79899 Other long term (current) drug therapy: Secondary | ICD-10-CM | POA: Diagnosis not present

## 2023-10-07 NOTE — Progress Notes (Signed)
 Office Visit Note  Patient: Mindy Grant             Date of Birth: May 11, 1955           MRN: [id number]             PCP: Nuala Manus POUR., MD Referring: Anitra Lynwood SAUNDERS, MD Visit Date: 10/07/2023 Occupation: Housekeeping  Subjective:  New Patient (Initial Visit) (Patient states she has joint pain in her hands, fingers, wrists, elbows, shoulders, neck, back, knees, hips. )    Discussed the use of AI scribe software for clinical note transcription with the patient, who gave verbal consent to proceed.  History of Present Illness   Mindy Grant is a 68 year old female with seronegative rheumatoid arthritis on hydroxychloroquine 400 mg daily here to establish care.  She has a history of rheumatoid arthritis diagnosed since 2020, with worsening joint pain and limited mobility. Her current medication is hydroxychloroquine. She previously tried and discontinued sulfasalazine because it was ineffective. She took methotrexate but had side effects particularly hair loss per outside records. Inflammatory joint pain involving her wrists and fingers and knees.  She also has a history of fibromyalgia, initially attributing her symptoms to it, including pain in her neck, shoulders, and shoulder blades. Over time, she noticed increased difficulty with mobility, particularly when rising from a seated position, and pain in her groin and left leg, which is more problematic than the right. This significantly impacts her ability to walk, describing a sensation of her leg 'tearing out of the joint.'  She underwent carpal tunnel surgery and was initially thought to have a trigger finger, but post-surgery, her symptoms worsened, now being classified as Dupuytren's contracture. 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  She experiences significant knee pain, particularly in the left  knee, present for nine to ten months. She describes an inability to squat fully due to swelling and pain in the back of her leg. She has received hip injections approximately three times, with limited relief, and is scheduled for another injection on the sixteenth. She is uncertain about the duration of relief provided by these injections.  Her social history includes working as a Financial trader, involving activities like squeezing rags and vacuuming, contributing to her hand pain. She has not had any specific injuries like fractures or dislocations. She has tried various over-the-counter treatments and supplements, including tart cherry, without significant relief. Ibuprofen 600 mg as needed has been partially helpful.     Activities of Daily Living:  Patient reports morning stiffness for not that long.   Patient Reports nocturnal pain.  Difficulty dressing/grooming: Denies Difficulty climbing stairs: Reports Difficulty getting out of chair: Reports Difficulty using hands for taps, buttons, cutlery, and/or writing: Reports  Review of Systems  Constitutional:  Positive for fatigue.  HENT:  Positive for mouth dryness. Negative for mouth sores.   Eyes:  Positive for dryness.  Respiratory:  Positive for shortness of breath.   Cardiovascular:  Negative for chest pain and palpitations.  Gastrointestinal:  Positive for constipation. Negative for blood in stool and diarrhea.  Endocrine: Negative for increased urination.  Genitourinary:  Negative for involuntary urination.  Musculoskeletal:  Positive for joint pain, joint pain, joint swelling, myalgias, muscle weakness, morning stiffness, muscle tenderness and myalgias. Negative for gait problem.  Skin:  Positive for hair loss and sensitivity to sunlight. Negative for  color change and rash.  Allergic/Immunologic: Negative for susceptible to infections.  Neurological:  Positive for headaches. Negative for dizziness.  Hematological:  Negative for  swollen glands.  Psychiatric/Behavioral:  Positive for depressed mood and sleep disturbance. The patient is not nervous/anxious.     PMFS History:  Patient Active Problem List   Diagnosis Date Noted   Generalized osteoarthritis of multiple sites 10/07/2023   Hip arthritis 05/30/2023   Blepharitis of upper and lower eyelids of both eyes 05/24/2023   Dry eye syndrome of both eyes 05/24/2023   High risk medication use 05/24/2023   Abnormal EKG 05/12/2023   DOE (dyspnea on exertion) 05/12/2023   Palpitations 05/12/2023   Precordial chest pain 05/12/2023   Osteopenia 05/03/2023   Flexor tenosynovitis of finger 04/23/2023   Female genital prolapse 11/08/2022   Astigmatism of both eyes with presbyopia 11/01/2022   Corneal scars, both eyes 11/01/2022   Glaucoma suspect, bilateral 11/01/2022   RAD (reactive airway disease) 02/27/2021   GERD (gastroesophageal reflux disease) 01/08/2021   Migraine headache 01/08/2021   Trigger middle finger of left hand 12/11/2020   Carpal tunnel syndrome, left 03/14/2019   Ulnar neuropathy of left upper extremity 03/14/2019   MDD (major depressive disorder) 12/07/2018   Rheumatoid arthritis involving multiple sites (HCC) 12/07/2018   Cupping of optic disc, bilateral 10/30/2018   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   Irritable bowel syndrome with constipation 07/21/2018   Fibromyalgia 03/06/2018   Senile hyperkeratosis 03/06/2018   Mass of right hand 03/02/2018   Chest pain 11/03/2016   Right groin pain 03/18/2016   Pelvic pain in female 12/25/2015   Abdominal bloating 10/28/2015   Leg pain, lateral 06/04/2014   Refusal of blood product 01/08/2014   Acquired hypothyroidism 03/13/2013   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/24/2013   Urinary incontinence 01/24/2013   Rectocele 11/30/2012   Thrombosed external hemorrhoid 02/16/2012    Past Medical History:  Diagnosis Date   Anxiety    Asthma    as a child-no  inhalers now   Complication of anesthesia    takes along time to wake up   Depression    Deviated nasal septum    Diverticulitis    Fibromyalgia    GERD (gastroesophageal reflux disease)    Insomnia    Multiple allergies    Pelvic prolapse    Thyroid disease     Family History  Problem Relation Age of Onset   Cancer Mother        lung   Hypertension Brother    Breast cancer Neg Hx    Past Surgical History:  Procedure Laterality Date   ABDOMINAL HYSTERECTOMY  03/29/1994   BREAST DUCTAL SYSTEM EXCISION  10/06/2011   Procedure: EXCISION DUCTAL SYSTEM BREAST;  Surgeon: Donnice Bury, MD;  Location: [facility];  Service: General;  Laterality: Left;   CARPAL TUNNEL RELEASE Right    CARPAL TUNNEL RELEASE Left 2024   CHOLECYSTECTOMY  01/28/2011   COLON SURGERY  01/15/2014   because of diverticulitis   EXCISIONAL HEMORRHOIDECTOMY     HAND SURGERY Right 2019   HERNIA REPAIR  2016   TONSILLECTOMY     VAGINAL PROLAPSE REPAIR     03/19/2013   Social History   Social History Narrative   Not on file   Immunization History  Administered Date(s) Administered   Tdap 04/10/2021     Objective: Vital Signs: BP 134/75 (BP Location: Right Arm, Patient Position: Sitting, Cuff Size: Normal)  Pulse 61   Resp 14   Ht 5' 6.25 (1.683 m)   Wt 135 lb (61.2 kg)   BMI 21.63 kg/m    Physical Exam HENT:     Mouth/Throat:     Mouth: Mucous membranes are moist.     Pharynx: Oropharynx is clear.  Eyes:     Conjunctiva/sclera: Conjunctivae normal.  Cardiovascular:     Rate and Rhythm: Normal rate and regular rhythm.  Pulmonary:     Effort: Pulmonary effort is normal.     Breath sounds: Normal breath sounds.  Musculoskeletal:     Right lower leg: No edema.     Left lower leg: No edema.  Lymphadenopathy:     Cervical: No cervical adenopathy.  Skin:    General: Skin is warm and dry.     Findings: No rash.  Neurological:     Mental Status: She is alert.   Psychiatric:        Mood and Affect: Mood normal.      Musculoskeletal Exam:  Elbows full ROM no tenderness or swelling Wrists full ROM no tenderness or swelling Heberdon's nodes throughout both hands, MCP bony widening, left 3rd MCP with thickening at the joint and proximally on flexor tendon Anterior hip pain provoked with internal rotation b/l, minimal lateral tenderness to pressure Knees full ROM, left knee posterior pain with flexion under weightbearing, no swelling Ankles full ROM no tenderness or swelling 5th MTP bunionette with medial deviation   Investigation: No additional findings.  Imaging: No results found.  Recent Labs: Lab Results  Component Value Date   WBC 5.1 10/04/2011   HGB 13.1 10/04/2011   PLT 275 10/04/2011   NA 138 10/04/2011   K 3.6 10/04/2011   CL 101 10/04/2011   CO2 25 10/04/2011   GLUCOSE 109 (H) 10/04/2011   BUN 9 10/04/2011   CREATININE 0.76 10/04/2011   CALCIUM 9.7 10/04/2011   GFRAA >90 10/04/2011    Speciality Comments: No specialty comments available.  Procedures:  No procedures performed Allergies: Codeine, Bioflavonoid products, Metronidazole, Piper, Proanthocyanidin, and Ondansetron    Assessment / Plan:     Visit Diagnoses: Rheumatoid arthritis of multiple sites with negative rheumatoid factor (HCC) - Plan: XR Hand 2 View Right, XR Hand 2 View Left, XR KNEE 3 VIEW LEFT, Sedimentation rate, C-reactive protein, Rheumatoid factor, Cyclic citrul peptide antibody, IgG Seronegative RA with no significant active joint inflammation on exam but pain and stiffness persistent. Hydroxychloroquine monotherapy has not seen great effect for her. Injectable biologics would be her next option given methotrexate and SSZ failures, but I don't se bad enough disease activity at this time. Can consider additional symptom management such as duloxetine. - Continue hydroxychloroquine, correct weight base dosing 300 mg daily. - Order hand x-ray to  differentiate between osteoarthritis and rheumatoid arthritis- MCP degenerative arthritis throughout right hand worse but no erosive changes, knee appears very intact - Order blood tests including rheumatoid factor, CCP, sedimentation rate, and C-reactive protein.  Generalized osteoarthritis of multiple sites Widespread osteoarthritis with significant symptoms in hips and knees. Previous hip injections provided limited relief. Examination shows typical osteoarthritis changes. - Continue scheduled hip injection on the 16th. - Provide information on nutritional supplements for osteoarthritis, including turmeric, omega-3, tart cherry, and ginger root extract.  Fibromyalgia Long-standing fibromyalgia with chronic pain. Symptoms overlap with osteoarthritis. - Review information on fibromyalgia management from the [facility] [place]  Department of Rheumatology.  Carpal tunnel syndrome post-surgery Persistent symptoms post-surgery with no significant improvement. Symptoms  ongoing post-surgery for trigger finger.        Orders: Orders Placed This Encounter  Procedures   XR Hand 2 View Right   XR Hand 2 View Left   XR KNEE 3 VIEW LEFT   Sedimentation rate   C-reactive protein   Rheumatoid factor   Cyclic citrul peptide antibody, IgG   No orders of the defined types were placed in this encounter.    Follow-Up Instructions: Return in about 4 weeks (around 11/04/2023) for New pt OA/+RA on HCQ f/u 9mo.   Lonni LELON Ester, MD  Note - This record has been created using [employer].  Chart creation errors have been sought, but may not always  have been located. Such creation errors do not reflect on  the standard of medical care.

## 2023-10-07 NOTE — Patient Instructions (Signed)
 I recommend checking out the [facility] [place] patient-centered guide for fibromyalgia and chronic pain management: [email or web address]

## 2023-10-10 LAB — RHEUMATOID FACTOR: Rheumatoid fact SerPl-aCnc: <10 [IU]/mL (ref <14)

## 2023-10-10 LAB — CYCLIC CITRUL PEPTIDE ANTIBODY, IGG: Cyclic Citrullin Peptide Ab: <16 U

## 2023-10-10 LAB — C-REACTIVE PROTEIN: CRP: <3 mg/L (ref <8.0)

## 2023-10-10 LAB — SEDIMENTATION RATE: Sed Rate: 2 mm/h (ref 0–30)

## 2023-11-14 NOTE — Progress Notes (Signed)
 Office Visit Note  Patient: Mindy Grant             Date of Birth: May 13, 1955           MRN: [id number]             PCP: Nuala Manus POUR., MD Referring: Nuala Manus POUR., MD Visit Date: 11/15/2023   Subjective:  Follow-up (Discuss labs )   Discussed the use of AI scribe software for clinical note transcription with the patient, who gave verbal consent to proceed.  History of Present Illness   Mindy Grant is a 68 year old female here for follow up of seronegative RA and osteoarthritis on HCQ 300 mg daily. She presents with joint pain and stiffness.  She experiences joint pain and stiffness primarily in her hands. She reports no morning locking of her fingers. However, her fingers tend to lock up when holding objects. She has a history of hand surgery, after which she developed symptoms that she described as similar to Dupuytren's contracture.  She takes ibuprofen intermittently for pain relief, finding it effective, but is cautious about regular use due to concerns about kidney function. Her kidney function was last checked in January and was normal. She has tried other medications like meloxicam, diclofenac, and Celebrex, but finds ibuprofen most effective. Tylenol  is ineffective for her joint pain.  She has a history of fibromyalgia and has tried medications such as Cymbalta, Savella, Effexor, Wellbutrin, and Zoloft, but these have caused excessive drowsiness and have not been effective in managing her symptoms. She feels 'washed out, drained, sleepy, tired' with these medications.  She has received steroid injections for hip pain, which have provided temporary relief, allowing her to walk more comfortably, but the effects are not long-lasting. She describes significant hip pain, particularly when getting up from a seated position, and has been told she may eventually need hip replacements.  Recent tests, including rheumatoid factor and CCP antibodies, were negative. She was previously  diagnosed with rheumatoid arthritis by another doctor despite negative blood tests, based on clinical findings. She takes hydroxychloroquine at a reduced dose due to concerns about potential eye side effects and has lost 25 pounds recently.      Previous HPI 10/07/23 Mindy Grant is a 68 year old female with seronegative rheumatoid arthritis on hydroxychloroquine 400 mg daily here to establish care.   She has a history of rheumatoid arthritis diagnosed since 2020, with worsening joint pain and limited mobility. Her current medication is hydroxychloroquine. She previously tried and discontinued sulfasalazine because it was ineffective. She took methotrexate but had side effects particularly hair loss per outside records. Inflammatory joint pain involving her wrists and fingers and knees.   She also has a history of fibromyalgia, initially attributing her symptoms to it, including pain in her neck, shoulders, and shoulder blades. Over time, she noticed increased difficulty with mobility, particularly when rising from a seated position, and pain in her groin and left leg, which is more problematic than the right. This significantly impacts her ability to walk, describing a sensation of her leg 'tearing out of the joint.'   She underwent carpal tunnel surgery and was initially thought to have a trigger finger, but post-surgery, her symptoms worsened, now being classified as Dupuytren's contracture. 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   She experiences significant knee pain, particularly in the left  knee, present for nine to ten months. She describes an inability to squat fully due to swelling and pain in the back of her leg. She has received hip injections approximately three times, with limited relief, and is scheduled for another injection on the sixteenth. She is  uncertain about the duration of relief provided by these injections.   Her social history includes working as a Financial trader, involving activities like squeezing rags and vacuuming, contributing to her hand pain. She has not had any specific injuries like fractures or dislocations. She has tried various over-the-counter treatments and supplements, including tart cherry, without significant relief. Ibuprofen 600 mg as needed has been partially helpful.   Review of Systems  Constitutional:  Positive for fatigue.  HENT:  Positive for mouth dryness. Negative for mouth sores.   Eyes:  Positive for dryness.  Respiratory:  Negative for shortness of breath.   Cardiovascular:  Negative for chest pain and palpitations.  Gastrointestinal:  Negative for blood in stool, constipation and diarrhea.  Endocrine: Negative for increased urination.  Genitourinary:  Negative for involuntary urination.  Musculoskeletal:  Positive for joint pain, joint pain, joint swelling, myalgias, muscle weakness, morning stiffness, muscle tenderness and myalgias. Negative for gait problem.  Skin:  Positive for hair loss. Negative for color change, rash and sensitivity to sunlight.  Allergic/Immunologic: Negative for susceptible to infections.  Neurological:  Positive for headaches. Negative for dizziness.  Hematological:  Negative for swollen glands.  Psychiatric/Behavioral:  Positive for depressed mood and sleep disturbance. The patient is nervous/anxious.     PMFS History:  Patient Active Problem List   Diagnosis Date Noted   Generalized osteoarthritis of multiple sites 10/07/2023   Hip arthritis 05/30/2023   Blepharitis of upper and lower eyelids of both eyes 05/24/2023   Dry eye syndrome of both eyes 05/24/2023   High risk medication use 05/24/2023   Abnormal EKG 05/12/2023   DOE (dyspnea on exertion) 05/12/2023   Palpitations 05/12/2023   Precordial chest pain 05/12/2023   Osteopenia 05/03/2023   Flexor  tenosynovitis of finger 04/23/2023   Female genital prolapse 11/08/2022   Astigmatism of both eyes with presbyopia 11/01/2022   Corneal scars, both eyes 11/01/2022   Glaucoma suspect, bilateral 11/01/2022   RAD (reactive airway disease) 02/27/2021   GERD (gastroesophageal reflux disease) 01/08/2021   Migraine headache 01/08/2021   Trigger middle finger of left hand 12/11/2020   Carpal tunnel syndrome, left 03/14/2019   Ulnar neuropathy of left upper extremity 03/14/2019   MDD (major depressive disorder) 12/07/2018   Rheumatoid arthritis involving multiple sites (HCC) 12/07/2018   Cupping of optic disc, bilateral 10/30/2018   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   Irritable bowel syndrome with constipation 07/21/2018   Fibromyalgia 03/06/2018   Senile hyperkeratosis 03/06/2018   Mass of right hand 03/02/2018   Chest pain 11/03/2016   Right groin pain 03/18/2016   Pelvic pain in female 12/25/2015   Abdominal bloating 10/28/2015   Leg pain, lateral 06/04/2014   Refusal of blood product 01/08/2014   Acquired hypothyroidism 03/13/2013   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/24/2013   Urinary incontinence 01/24/2013   Rectocele 11/30/2012   Thrombosed external hemorrhoid 02/16/2012    Past Medical History:  Diagnosis Date   Anxiety    Asthma    as a child-no inhalers now   Complication of anesthesia    takes along time to wake up   Depression    Deviated nasal septum    Diverticulitis  Fibromyalgia    GERD (gastroesophageal reflux disease)    Insomnia    Multiple allergies    Pelvic prolapse    Thyroid disease     Family History  Problem Relation Age of Onset   Cancer Mother        lung   Hypertension Brother    Breast cancer Neg Hx    Past Surgical History:  Procedure Laterality Date   ABDOMINAL HYSTERECTOMY  03/29/1994   BREAST DUCTAL SYSTEM EXCISION  10/06/2011   Procedure: EXCISION DUCTAL SYSTEM BREAST;  Surgeon: Donnice Bury, MD;  Location: [facility];  Service: General;  Laterality: Left;   CARPAL TUNNEL RELEASE Right    CARPAL TUNNEL RELEASE Left 2024   CHOLECYSTECTOMY  01/28/2011   COLON SURGERY  01/15/2014   because of diverticulitis   EXCISIONAL HEMORRHOIDECTOMY     HAND SURGERY Right 2019   HERNIA REPAIR  2016   TONSILLECTOMY     VAGINAL PROLAPSE REPAIR     03/19/2013   Social History   Social History Narrative   Not on file   Immunization History  Administered Date(s) Administered   Tdap 04/10/2021     Objective: Vital Signs: BP 116/65 (BP Location: Left Arm, Patient Position: Sitting)   Pulse 72   Resp 16   Ht 5' 6 (1.676 m)   Wt 133 lb 3.2 oz (60.4 kg)   BMI 21.50 kg/m    Physical Exam Eyes:     Conjunctiva/sclera: Conjunctivae normal.  Cardiovascular:     Rate and Rhythm: Normal rate and regular rhythm.  Pulmonary:     Effort: Pulmonary effort is normal.     Breath sounds: Normal breath sounds.  Lymphadenopathy:     Cervical: No cervical adenopathy.  Skin:    General: Skin is warm and dry.     Findings: No rash.  Neurological:     Mental Status: She is alert.  Psychiatric:        Mood and Affect: Mood normal.      Musculoskeletal Exam:  Elbows full ROM no tenderness or swelling Wrists full ROM no tenderness or swelling Heberdon's nodes throughout both hands, MCP bony widening, left 3rd MCP with thickening at the joint and proximally on flexor tendon Anterior hip pain provoked with internal rotation b/l, minimal lateral tenderness to pressure Knees full ROM, left knee posterior pain with flexion under weightbearing, no swelling Ankles full ROM no tenderness or swelling 5th MTP bunionette with medial deviation  Investigation: No additional findings.  Imaging: No results found.  Recent Labs: Lab Results  Component Value Date   WBC 5.1 10/04/2011   HGB 13.1 10/04/2011   PLT 275 10/04/2011   NA 138 10/04/2011   K 3.6 10/04/2011   CL  101 10/04/2011   CO2 25 10/04/2011   GLUCOSE 109 (H) 10/04/2011   BUN 9 10/04/2011   CREATININE 0.76 10/04/2011   CALCIUM 9.7 10/04/2011   GFRAA >90 10/04/2011    Speciality Comments: No specialty comments available.  Procedures:  No procedures performed Allergies: Codeine, Bioflavonoid products, Metronidazole, Piper, Proanthocyanidin, and Ondansetron    Assessment / Plan:     Visit Diagnoses: Rheumatoid arthritis of multiple sites with negative rheumatoid factor (HCC) No active disease based on negative rheumatoid factor, CCP antibodies, and normal inflammatory markers. Hydroxychloroquine use questioned due to lack of active RA evidence. - Discontinue hydroxychloroquine appears to be in remission/quiescent, unless drug is just completely masking any signs - Consider re-evaluation with ultrasound or lab  tests if symptoms worsen. - Consider short-term steroids for significant flare if that happens.  Fibromyalgia - Plan: Baclofen  5 MG TABS Chronic muscular and nerve pain likely compensatory for osteoarthritis. Symptoms include fatigue, concentration issues, and headaches. Previous medication trials not tolerated due to side effects.Not interested in any trial of SNRI or alternative neuropathy medication currently.  Generalized osteoarthritis involving hips and hands Degenerative changes in knuckle joints and hips with significant pain and stiffness affecting mobility. Normal kidney function permits NSAID use. - Continue ibuprofen 600 mg as needed, up to 1600 mg daily. - Consider meloxicam if needed. - Discuss potential hip replacement if symptoms worsen.  Dupuytren's contracture of hand Thickened tissue over tendons limits finger extension. Post-surgical changes with increased finger locking.      Orders: No orders of the defined types were placed in this encounter.  Meds ordered this encounter  Medications   Baclofen  5 MG TABS    Sig: Take 1 tablet (5 mg total) by mouth at  bedtime as needed.    Dispense:  30 tablet    Refill:  2     Follow-Up Instructions: Return in about 3 months (around 02/15/2024) for OA/FMS NSAID f/u 3mos.   Lonni LELON Ester, MD  Note - This record has been created using [employer].  Chart creation errors have been sought, but may not always  have been located. Such creation errors do not reflect on  the standard of medical care.

## 2023-11-15 ENCOUNTER — Ambulatory Visit: Attending: Internal Medicine | Admitting: Internal Medicine

## 2023-11-15 ENCOUNTER — Encounter: Payer: Self-pay | Admitting: Internal Medicine

## 2023-11-15 VITALS — BP 116/65 | HR 72 | Resp 16 | Ht 66.0 in | Wt 133.2 lb

## 2023-11-15 DIAGNOSIS — M797 Fibromyalgia: Secondary | ICD-10-CM | POA: Diagnosis not present

## 2023-11-15 DIAGNOSIS — M159 Polyosteoarthritis, unspecified: Secondary | ICD-10-CM | POA: Diagnosis not present

## 2023-11-15 DIAGNOSIS — M0609 Rheumatoid arthritis without rheumatoid factor, multiple sites: Secondary | ICD-10-CM | POA: Diagnosis not present

## 2023-11-15 MED ORDER — BACLOFEN 5 MG PO TABS: 5.0000 mg | ORAL_TABLET | Freq: Every evening | ORAL | 2 refills | Status: DC | PRN

## 2023-11-15 NOTE — Patient Instructions (Signed)
 I recommend checking out the [facility] [place] patient-centered guide for fibromyalgia and chronic pain management: [email or web address]

## 2024-02-03 ENCOUNTER — Other Ambulatory Visit: Payer: Self-pay | Admitting: Internal Medicine

## 2024-02-03 DIAGNOSIS — M797 Fibromyalgia: Secondary | ICD-10-CM

## 2024-02-03 NOTE — Telephone Encounter (Signed)
 Last Fill: 11/15/2023  Next Visit: 02/10/2024  Last Visit: 11/15/2023  Dx: Fibromyalgia   Current Dose per office note on 11/15/2023: Baclofen  5 MG TABS   Okay to refill Baclofen ?

## 2024-02-06 NOTE — Progress Notes (Signed)
 Office Visit Note  Patient: Mindy Grant             Date of Birth: 1955/06/30           MRN: [id number]             PCP: Nuala Manus POUR., MD Referring: Nuala Manus POUR., MD Visit Date: 02/20/2024   Subjective:   Discussed the use of AI scribe software for clinical note transcription with the patient, who gave verbal consent to proceed.  History of Present Illness   Mindy Grant is a 68 y.o. female here for follow up of seronegative RA and osteoarthritis on HCQ 300 mg daily and ibuprofen as needed recently about 800 mg BID.    She experiences significant hip pain, particularly in the right hip, which is more severe than the left. Both hips have been recommended for replacement. The pain can be so severe that it affects her ability to walk. She is scheduled for an injection in the hip to manage the pain, pending insurance approval, expected within the next week or two.  She also has knee pain, especially when squatting, which she finds impossible due to the pain. The pain is localized to the anterior aspect of the knee, accompanied by swelling and a sensation of the knee being 'so fat it doesn't bend'. 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  Regarding her arthritis, she has resumed taking hydroxychloroquine (Plaquenil) after noticing increased symptoms upon stopping it. She is also taking ibuprofen 800 mg twice a day, which she finds helpful, although she is concerned about potential kidney and liver side effects. She has not experienced any gastrointestinal issues from ibuprofen, despite having a history of hernia.  She has a history of rheumatoid arthritis. She mentions soreness in her joints, particularly in the morning, and notes that her symptoms vary depending on her activity level. She has previously been on muscle relaxers like baclofen  but stopped due to concerns about interactions with methylene blue and because it made her sleepy  without noticeable benefit.  Previous HPI 11/15/2023 Mindy Grant is a 68 year old female here for follow up of seronegative RA and osteoarthritis on HCQ 300 mg daily. She presents with joint pain and stiffness.   She experiences joint pain and stiffness primarily in her hands. She reports no morning locking of her fingers. However, her fingers tend to lock up when holding objects. She has a history of hand surgery, after which she developed symptoms that she described as similar to Dupuytren's contracture.   She takes ibuprofen intermittently for pain relief, finding it effective, but is cautious about regular use due to concerns about kidney function. Her kidney function was last checked in January and was normal. She has tried other medications like meloxicam, diclofenac, and Celebrex, but finds ibuprofen most effective. Tylenol  is ineffective for her joint pain.   She has a history of fibromyalgia and has tried medications such as Cymbalta, Savella, Effexor, Wellbutrin, and Zoloft, but these have caused excessive drowsiness and have not been effective in managing her symptoms. She feels 'washed out, drained, sleepy, tired' with these medications.   She has received steroid injections for hip pain, which have provided temporary relief, allowing her to walk more comfortably, but the effects are not long-lasting. She describes significant hip pain, particularly when getting up from a seated position, and has been told she may eventually need hip replacements.   Recent tests, including rheumatoid factor and CCP  antibodies, were negative. She was previously diagnosed with rheumatoid arthritis by another doctor despite negative blood tests, based on clinical findings. She takes hydroxychloroquine at a reduced dose due to concerns about potential eye side effects and has lost 25 pounds recently.       Previous HPI 10/07/23 Chistina Grant is a 68 year old female with seronegative rheumatoid arthritis on  hydroxychloroquine 400 mg daily here to establish care.   She has a history of rheumatoid arthritis diagnosed since 2020, with worsening joint pain and limited mobility. Her current medication is hydroxychloroquine. She previously tried and discontinued sulfasalazine because it was ineffective. She took methotrexate but had side effects particularly hair loss per outside records. Inflammatory joint pain involving her wrists and fingers and knees.   She also has a history of fibromyalgia, initially attributing her symptoms to it, including pain in her neck, shoulders, and shoulder blades. Over time, she noticed increased difficulty with mobility, particularly when rising from a seated position, and pain in her groin and left leg, which is more problematic than the right. This significantly impacts her ability to walk, describing a sensation of her leg 'tearing out of the joint.'   She underwent carpal tunnel surgery and was initially thought to have a trigger finger, but post-surgery, her symptoms worsened, now being classified as Dupuytren's contracture. 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   She experiences significant knee pain, particularly in the left knee, present for nine to ten months. She describes an inability to squat fully due to swelling and pain in the back of her leg. She has received hip injections approximately three times, with limited relief, and is scheduled for another injection on the sixteenth. She is uncertain about the duration of relief provided by these injections.   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 She has not had any specific injuries like fractures or dislocations. She has tried various over-the-counter treatments and supplements,  including tart cherry, without significant relief. Ibuprofen 600 mg as needed has been partially helpful.   Review of Systems  Constitutional:  Positive for fatigue.  HENT:  Positive for mouth dryness. Negative for mouth sores.   Eyes:  Positive for dryness.  Respiratory:  Negative for shortness of breath.   Cardiovascular:  Negative for chest pain and palpitations.  Gastrointestinal:  Negative for blood in stool, constipation and diarrhea.  Endocrine: Negative for increased urination.  Genitourinary:  Negative for involuntary urination.  Musculoskeletal:  Positive for joint pain, joint pain, joint swelling, myalgias, muscle weakness, morning stiffness, muscle tenderness and myalgias. Negative for gait problem.  Skin:  Positive for hair loss. Negative for color change, rash and sensitivity to sunlight.  Allergic/Immunologic: Negative for susceptible to infections.  Neurological:  Positive for headaches. Negative for dizziness.  Hematological:  Negative for swollen glands.  Psychiatric/Behavioral:  Positive for sleep disturbance. Negative for depressed mood. The patient is nervous/anxious.     PMFS History:  Patient Active Problem List   Diagnosis Date Noted   Generalized osteoarthritis of multiple sites 10/07/2023   Hip arthritis 05/30/2023   Blepharitis of upper and lower eyelids of both eyes 05/24/2023   Dry eye syndrome of both eyes 05/24/2023   High risk medication use 05/24/2023   Abnormal EKG 05/12/2023   DOE (dyspnea on exertion)  05/12/2023   Palpitations 05/12/2023   Precordial chest pain 05/12/2023   Osteopenia 05/03/2023   Flexor tenosynovitis of finger 04/23/2023   Female genital prolapse 11/08/2022   Astigmatism of both eyes with presbyopia 11/01/2022   Corneal scars, both eyes 11/01/2022   Glaucoma suspect, bilateral 11/01/2022   RAD (reactive airway disease) 02/27/2021   GERD (gastroesophageal reflux disease) 01/08/2021   Migraine headache 01/08/2021   Trigger  middle finger of left hand 12/11/2020   Carpal tunnel syndrome, left 03/14/2019   Ulnar neuropathy of left upper extremity 03/14/2019   MDD (major depressive disorder) 12/07/2018   Rheumatoid arthritis involving multiple sites (HCC) 12/07/2018   Cupping of optic disc, bilateral 10/30/2018   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   Irritable bowel syndrome with constipation 07/21/2018   Fibromyalgia 03/06/2018   Senile hyperkeratosis 03/06/2018   Mass of right hand 03/02/2018   Chest pain 11/03/2016   Right groin pain 03/18/2016   Pelvic pain in female 12/25/2015   Abdominal bloating 10/28/2015   Leg pain, lateral 06/04/2014   Refusal of blood product 01/08/2014   Acquired hypothyroidism 03/13/2013   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/24/2013   Urinary incontinence 01/24/2013   Rectocele 11/30/2012   Thrombosed external hemorrhoid 02/16/2012    Past Medical History:  Diagnosis Date   Anxiety    Asthma    as a child-no inhalers now   Complication of anesthesia    takes along time to wake up   Depression    Deviated nasal septum    Diverticulitis    Fibromyalgia    GERD (gastroesophageal reflux disease)    Insomnia    Multiple allergies    Pelvic prolapse    Thyroid disease     Family History  Problem Relation Age of Onset   Cancer Mother        lung   Hypertension Brother    Breast cancer Neg Hx    Past Surgical History:  Procedure Laterality Date   ABDOMINAL HYSTERECTOMY  03/29/1994   BREAST DUCTAL SYSTEM EXCISION  10/06/2011   Procedure: EXCISION DUCTAL SYSTEM BREAST;  Surgeon: Donnice Bury, MD;  Location: [facility];  Service: General;  Laterality: Left;   CARPAL TUNNEL RELEASE Right    CARPAL TUNNEL RELEASE Left 2024   CHOLECYSTECTOMY  01/28/2011   COLON SURGERY  01/15/2014   because of diverticulitis   EXCISIONAL HEMORRHOIDECTOMY     HAND SURGERY Right 2019   HERNIA REPAIR  2016   TONSILLECTOMY      VAGINAL PROLAPSE REPAIR     03/19/2013   Social History   Social History Narrative   Not on file   Immunization History  Administered Date(s) Administered   Tdap 04/10/2021     Objective: Vital Signs: BP 119/60   Pulse 85   Temp (!) 97.4 F (36.3 C)   Resp 16   Ht 5' 6 (1.676 m)   Wt 140 lb (63.5 kg)   BMI 22.60 kg/m    Physical Exam Eyes:     Conjunctiva/sclera: Conjunctivae normal.  Cardiovascular:     Rate and Rhythm: Normal rate and regular rhythm.  Pulmonary:     Effort: Pulmonary effort is normal.     Breath sounds: Normal breath sounds.  Lymphadenopathy:     Cervical: No cervical adenopathy.  Skin:    General: Skin is warm and dry.  Neurological:     Mental Status: She is alert.  Psychiatric:  Mood and Affect: Mood normal.      Musculoskeletal Exam:  Elbows full ROM no tenderness or swelling Wrists full ROM no tenderness or swelling Heberdon's nodes throughout both hands, MCP bony widening, left 3rd MCP with thickening at the joint and proximally on flexor tendon Anterior hip pain provoked with internal rotation b/l, minimal lateral tenderness to pressure Knees full ROM, left knee posterior pain with flexion under weightbearing, no swelling Ankles full ROM no tenderness or swelling 5th MTP bunionette with medial deviation      Investigation: No additional findings.  Imaging: No results found.  Recent Labs: Lab Results  Component Value Date   WBC 5.1 10/04/2011   HGB 13.1 10/04/2011   PLT 275 10/04/2011   NA 140 02/20/2024   K 3.9 02/20/2024   CL 105 02/20/2024   CO2 27 02/20/2024   GLUCOSE 117 (H) 02/20/2024   BUN 16 02/20/2024   CREATININE 0.78 02/20/2024   BILITOT 0.3 02/20/2024   AST 16 02/20/2024   ALT 15 02/20/2024   PROT 6.5 02/20/2024   CALCIUM 9.3 02/20/2024   GFRAA >90 10/04/2011    Speciality Comments: No specialty comments available.  Procedures:  No procedures performed Allergies: Codeine, Bioflavonoid  products, Metronidazole, Other, Piper, Proanthocyanidin, and Ondansetron    Assessment / Plan:     Visit Diagnoses: Rheumatoid arthritis of multiple sites with negative rheumatoid factor (HCC) - Plan: Comprehensive metabolic panel with GFR Seronegative rheumatoid arthritis with no significant inflammation or swelling in hands. Hydroxychloroquine resumed with perceived benefit. Ibuprofen used for pain management without adverse effects. - Continue hydroxychloroquine 200 mg daily. - Continue ibuprofen as needed for pain management.    Osteoarthritis of bilateral hips Chronic osteoarthritis in both hips, right hip more symptomatic. Concerns about steroid injections accelerating cartilage loss, but studies show no significant acceleration with injections not more than three times a year. Encouraged to proceed with scheduled hip injection. - Monitor symptoms post-injection and consider knee injection if no improvement in knee pain.  Patellofemoral pain syndrome, right knee Right knee pain due to patellofemoral pain syndrome, not osteoarthritis. Likely related to compensation for hip issues. - Monitor knee symptoms post-hip injection. - Consider knee injection six weeks after hip injection if no improvement.  Rheumatoid arthritis, multiple sites, seronegative  Dupuytren's contracture, right hand Dupuytren's contracture in the right hand causing difficulty with wrist movement. - Continue to monitor symptoms and manage conservatively.       Orders: Orders Placed This Encounter  Procedures   Comprehensive metabolic panel with GFR   No orders of the defined types were placed in this encounter.    Follow-Up Instructions: Return in about 8 weeks (around 04/16/2024), or if symptoms worsen or fail to improve, for for left knee injection.   Lonni LELON Ester, MD  Note - This record has been created using [employer].  Chart creation errors have been sought, but may not always  have been  located. Such creation errors do not reflect on  the standard of medical care.

## 2024-02-20 ENCOUNTER — Encounter: Payer: Self-pay | Admitting: Internal Medicine

## 2024-02-20 ENCOUNTER — Ambulatory Visit: Attending: Internal Medicine | Admitting: Internal Medicine

## 2024-02-20 VITALS — BP 119/60 | HR 85 | Temp 97.4°F | Resp 16 | Ht 66.0 in | Wt 140.0 lb

## 2024-02-20 DIAGNOSIS — M0609 Rheumatoid arthritis without rheumatoid factor, multiple sites: Secondary | ICD-10-CM | POA: Diagnosis not present

## 2024-02-20 DIAGNOSIS — K219 Gastro-esophageal reflux disease without esophagitis: Secondary | ICD-10-CM

## 2024-02-20 DIAGNOSIS — M797 Fibromyalgia: Secondary | ICD-10-CM

## 2024-02-20 DIAGNOSIS — M159 Polyosteoarthritis, unspecified: Secondary | ICD-10-CM

## 2024-02-21 LAB — COMPREHENSIVE METABOLIC PANEL WITH GFR
AG Ratio: 1.7 (calc) (ref 1.0–2.5)
ALT: 15 U/L (ref 6–29)
AST: 16 U/L (ref 10–35)
Albumin: 4.1 g/dL (ref 3.6–5.1)
Alkaline phosphatase (APISO): 51 U/L (ref 37–153)
BUN: 16 mg/dL (ref 7–25)
CO2: 27 mmol/L (ref 20–32)
Calcium: 9.3 mg/dL (ref 8.6–10.4)
Chloride: 105 mmol/L (ref 98–110)
Creat: 0.78 mg/dL (ref 0.50–1.05)
Globulin: 2.4 g/dL (ref 1.9–3.7)
Glucose, Bld: 117 mg/dL — ABNORMAL HIGH (ref 65–99)
Potassium: 3.9 mmol/L (ref 3.5–5.3)
Sodium: 140 mmol/L (ref 135–146)
Total Bilirubin: 0.3 mg/dL (ref 0.2–1.2)
Total Protein: 6.5 g/dL (ref 6.1–8.1)
eGFR: 84 mL/min/1.73m2 (ref >=60)

## 2024-02-26 MED ORDER — HYDROXYCHLOROQUINE SULFATE 200 MG PO TABS: 200.0000 mg | ORAL_TABLET | Freq: Every day | ORAL | 1 refills | Status: AC

## 2024-04-10 NOTE — Progress Notes (Unsigned)
 "  Office Visit Note  Patient: Mindy Grant             Date of Birth: 07/30/1955           MRN: [id number]             PCP: Nuala Manus POUR., MD Referring: Nuala Manus POUR., MD Visit Date: 04/23/2024   Subjective:  No chief complaint on file.   History of Present Illness: Mindy Grant is a 69 y.o. female here for follow up of seronegative RA and osteoarthritis on HCQ 300 mg daily and ibuprofen as needed recently about 800 mg BID.    Previous HPI 02/20/2024 Mindy Grant is a 69 y.o. female here for follow up of seronegative RA and osteoarthritis on HCQ 300 mg daily and ibuprofen as needed recently about 800 mg BID.     She experiences significant hip pain, particularly in the right hip, which is more severe than the left. Both hips have been recommended for replacement. The pain can be so severe that it affects her ability to walk. She is scheduled for an injection in the hip to manage the pain, pending insurance approval, expected within the next week or two.   She also has knee pain, especially when squatting, which she finds impossible due to the pain. The pain is localized to the anterior aspect of the knee, accompanied by swelling and a sensation of the knee being 'so fat it doesn't bend'. 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   Regarding her arthritis, she has resumed taking hydroxychloroquine  (Plaquenil ) after noticing increased symptoms upon stopping it. She is also taking ibuprofen 800 mg twice a day, which she finds helpful, although she is concerned about potential kidney and liver side effects. She has not experienced any gastrointestinal issues from ibuprofen, despite having a history of hernia.   She has a history of rheumatoid arthritis. She mentions soreness in her joints, particularly in the morning, and notes that her symptoms vary depending on her activity level. She has previously been on muscle relaxers like baclofen  but  stopped due to concerns about interactions with methylene blue and because it made her sleepy without noticeable benefit.   Previous HPI 11/15/2023 Mindy Grant is a 69 year old female here for follow up of seronegative RA and osteoarthritis on HCQ 300 mg daily. She presents with joint pain and stiffness.   She experiences joint pain and stiffness primarily in her hands. She reports no morning locking of her fingers. However, her fingers tend to lock up when holding objects. She has a history of hand surgery, after which she developed symptoms that she described as similar to Dupuytren's contracture.   She takes ibuprofen intermittently for pain relief, finding it effective, but is cautious about regular use due to concerns about kidney function. Her kidney function was last checked in January and was normal. She has tried other medications like meloxicam, diclofenac, and Celebrex, but finds ibuprofen most effective. Tylenol  is ineffective for her joint pain.   She has a history of fibromyalgia and has tried medications such as Cymbalta, Savella, Effexor, Wellbutrin, and Zoloft, but these have caused excessive drowsiness and have not been effective in managing her symptoms. She feels 'washed out, drained, sleepy, tired' with these medications.   She has received steroid injections for hip pain, which have provided temporary relief, allowing her to walk more comfortably, but the effects are not long-lasting. She describes significant hip pain, particularly when  getting up from a seated position, and has been told she may eventually need hip replacements.   Recent tests, including rheumatoid factor and CCP antibodies, were negative. She was previously diagnosed with rheumatoid arthritis by another doctor despite negative blood tests, based on clinical findings. She takes hydroxychloroquine  at a reduced dose due to concerns about potential eye side effects and has lost 25 pounds recently.       Previous  HPI 10/07/23 Mindy Grant is a 69 year old female with seronegative rheumatoid arthritis on hydroxychloroquine  400 mg daily here to establish care.   She has a history of rheumatoid arthritis diagnosed since 2020, with worsening joint pain and limited mobility. Her current medication is hydroxychloroquine . She previously tried and discontinued sulfasalazine because it was ineffective. She took methotrexate but had side effects particularly hair loss per outside records. Inflammatory joint pain involving her wrists and fingers and knees.   She also has a history of fibromyalgia, initially attributing her symptoms to it, including pain in her neck, shoulders, and shoulder blades. Over time, she noticed increased difficulty with mobility, particularly when rising from a seated position, and pain in her groin and left leg, which is more problematic than the right. This significantly impacts her ability to walk, describing a sensation of her leg 'tearing out of the joint.'   She underwent carpal tunnel surgery and was initially thought to have a trigger finger, but post-surgery, her symptoms worsened, now being classified as Dupuytren's contracture. 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   She experiences significant knee pain, particularly in the left knee, present for nine to ten months. She describes an inability to squat fully due to swelling and pain in the back of her leg. She has received hip injections approximately three times, with limited relief, and is scheduled for another injection on the sixteenth. She is uncertain about the duration of relief provided by these injections.   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 She has not had any specific injuries like  fractures or dislocations. She has tried various over-the-counter treatments and supplements, including tart cherry, without significant relief. Ibuprofen 600 mg as needed has been partially helpful.   No Rheumatology ROS completed.   PMFS History:  Patient Active Problem List   Diagnosis Date Noted   Generalized osteoarthritis of multiple sites 10/07/2023   Hip arthritis 05/30/2023   Blepharitis of upper and lower eyelids of both eyes 05/24/2023   Dry eye syndrome of both eyes 05/24/2023   High risk medication use 05/24/2023   Abnormal EKG 05/12/2023   DOE (dyspnea on exertion) 05/12/2023   Palpitations 05/12/2023   Precordial chest pain 05/12/2023   Osteopenia 05/03/2023   Flexor tenosynovitis of finger 04/23/2023   Female genital prolapse 11/08/2022   Astigmatism of both eyes with presbyopia 11/01/2022   Corneal scars, both eyes 11/01/2022   Glaucoma suspect, bilateral 11/01/2022   RAD (reactive airway disease) 02/27/2021   GERD (gastroesophageal reflux disease) 01/08/2021   Migraine headache 01/08/2021   Trigger middle finger of left hand 12/11/2020   Carpal tunnel syndrome, left 03/14/2019   Ulnar neuropathy of left upper extremity 03/14/2019   MDD (major depressive disorder) 12/07/2018   Rheumatoid arthritis involving multiple sites (HCC) 12/07/2018   Cupping of optic disc, bilateral 10/30/2018  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   Irritable bowel syndrome with constipation 07/21/2018   Fibromyalgia 03/06/2018   Senile hyperkeratosis 03/06/2018   Mass of right hand 03/02/2018   Chest pain 11/03/2016   Right groin pain 03/18/2016   Pelvic pain in female 12/25/2015   Abdominal bloating 10/28/2015   Leg pain, lateral 06/04/2014   Refusal of blood product 01/08/2014   Acquired hypothyroidism 03/13/2013   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/24/2013   Urinary incontinence 01/24/2013   Rectocele 11/30/2012   Thrombosed external  hemorrhoid 02/16/2012    Past Medical History:  Diagnosis Date   Anxiety    Asthma    as a child-no inhalers now   Complication of anesthesia    takes along time to wake up   Depression    Deviated nasal septum    Diverticulitis    Fibromyalgia    GERD (gastroesophageal reflux disease)    Insomnia    Multiple allergies    Pelvic prolapse    Thyroid disease     Family History  Problem Relation Age of Onset   Cancer Mother        lung   Hypertension Brother    Breast cancer Neg Hx    Past Surgical History:  Procedure Laterality Date   ABDOMINAL HYSTERECTOMY  03/29/1994   BREAST DUCTAL SYSTEM EXCISION  10/06/2011   Procedure: EXCISION DUCTAL SYSTEM BREAST;  Surgeon: Donnice Bury, MD;  Location: [facility];  Service: General;  Laterality: Left;   CARPAL TUNNEL RELEASE Right    CARPAL TUNNEL RELEASE Left 2024   CHOLECYSTECTOMY  01/28/2011   COLON SURGERY  01/15/2014   because of diverticulitis   EXCISIONAL HEMORRHOIDECTOMY     HAND SURGERY Right 2019   HERNIA REPAIR  2016   TONSILLECTOMY     VAGINAL PROLAPSE REPAIR     03/19/2013   Social History   Social History Narrative   Not on file   Immunization History  Administered Date(s) Administered   Tdap 04/10/2021     Objective: Vital Signs: There were no vitals taken for this visit.   Physical Exam   Musculoskeletal Exam: ***   Investigation: No additional findings.  Imaging: No results found.  Recent Labs: Lab Results  Component Value Date   WBC 5.1 10/04/2011   HGB 13.1 10/04/2011   PLT 275 10/04/2011   NA 140 02/20/2024   K 3.9 02/20/2024   CL 105 02/20/2024   CO2 27 02/20/2024   GLUCOSE 117 (H) 02/20/2024   BUN 16 02/20/2024   CREATININE 0.78 02/20/2024   BILITOT 0.3 02/20/2024   AST 16 02/20/2024   ALT 15 02/20/2024   PROT 6.5 02/20/2024   CALCIUM 9.3 02/20/2024   GFRAA >90 10/04/2011    Speciality Comments: No specialty comments available.  Procedures:  No  procedures performed Allergies: Codeine, Bioflavonoid products, Metronidazole, Other, Piper, Proanthocyanidin, and Ondansetron    Assessment / Plan:     Visit Diagnoses:  Assessment & Plan Rheumatoid arthritis of multiple sites with negative rheumatoid factor (HCC)      ***  Follow-Up Instructions: No follow-ups on file.   Quanika Solem M Eiliana Drone, CMA  Note - This record has been created using [employer].  Chart creation errors have been sought, but may not always  have been located. Such creation errors do not reflect on  the standard of medical care. "

## 2024-04-10 NOTE — Assessment & Plan Note (Signed)
 Mindy Grant

## 2024-04-19 ENCOUNTER — Other Ambulatory Visit: Payer: Self-pay

## 2024-04-19 MED ORDER — IBUPROFEN 800 MG PO TABS: 800.0000 mg | ORAL_TABLET | Freq: Three times a day (TID) | ORAL | 0 refills | Status: AC | PRN

## 2024-04-19 NOTE — Telephone Encounter (Signed)
 Last Fill: previously filled by other provider  Next Visit: 05/14/2024  Last Visit: 02/20/2024  Dx: Rheumatoid arthritis of multiple sites with negative rheumatoid facto   Current Dose per office note on 02/20/2024: Dose not discussed  Okay to refill Ibuprofen  ?

## 2024-04-23 ENCOUNTER — Ambulatory Visit: Admitting: Internal Medicine

## 2024-04-23 DIAGNOSIS — M0609 Rheumatoid arthritis without rheumatoid factor, multiple sites: Secondary | ICD-10-CM

## 2024-05-02 NOTE — Progress Notes (Unsigned)
 "  Office Visit Note  Patient: Mindy Grant             Date of Birth: Nov 04, 1955           MRN: [id number]             PCP: Nuala Manus POUR., MD Referring: Nuala Manus POUR., MD Visit Date: 05/14/2024   Subjective:  No chief complaint on file.   History of Present Illness: Mindy Grant is a 68 y.o. female here for follow up of seronegative RA and osteoarthritis on HCQ 300 mg daily and ibuprofen  as needed recently about 800 mg BID.    Previous HPI 02/20/2024 Mindy Grant is a 69 y.o. female here for follow up of seronegative RA and osteoarthritis on HCQ 300 mg daily and ibuprofen  as needed recently about 800 mg BID.     She experiences significant hip pain, particularly in the right hip, which is more severe than the left. Both hips have been recommended for replacement. The pain can be so severe that it affects her ability to walk. She is scheduled for an injection in the hip to manage the pain, pending insurance approval, expected within the next week or two.   She also has knee pain, especially when squatting, which she finds impossible due to the pain. The pain is localized to the anterior aspect of the knee, accompanied by swelling and a sensation of the knee being 'so fat it doesn't bend'. 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   Regarding her arthritis, she has resumed taking hydroxychloroquine  (Plaquenil ) after noticing increased symptoms upon stopping it. She is also taking ibuprofen  800 mg twice a day, which she finds helpful, although she is concerned about potential kidney and liver side effects. She has not experienced any gastrointestinal issues from ibuprofen , despite having a history of hernia.   She has a history of rheumatoid arthritis. She mentions soreness in her joints, particularly in the morning, and notes that her symptoms vary depending on her activity level. She has previously been on muscle relaxers like baclofen  but  stopped due to concerns about interactions with methylene blue and because it made her sleepy without noticeable benefit.   Previous HPI 11/15/2023 Mindy Grant is a 69 year old female here for follow up of seronegative RA and osteoarthritis on HCQ 300 mg daily. She presents with joint pain and stiffness.   She experiences joint pain and stiffness primarily in her hands. She reports no morning locking of her fingers. However, her fingers tend to lock up when holding objects. She has a history of hand surgery, after which she developed symptoms that she described as similar to Dupuytren's contracture.   She takes ibuprofen  intermittently for pain relief, finding it effective, but is cautious about regular use due to concerns about kidney function. Her kidney function was last checked in January and was normal. She has tried other medications like meloxicam, diclofenac, and Celebrex, but finds ibuprofen  most effective. Tylenol  is ineffective for her joint pain.   She has a history of fibromyalgia and has tried medications such as Cymbalta, Savella, Effexor, Wellbutrin, and Zoloft, but these have caused excessive drowsiness and have not been effective in managing her symptoms. She feels 'washed out, drained, sleepy, tired' with these medications.   She has received steroid injections for hip pain, which have provided temporary relief, allowing her to walk more comfortably, but the effects are not long-lasting. She describes significant hip pain, particularly when  getting up from a seated position, and has been told she may eventually need hip replacements.   Recent tests, including rheumatoid factor and CCP antibodies, were negative. She was previously diagnosed with rheumatoid arthritis by another doctor despite negative blood tests, based on clinical findings. She takes hydroxychloroquine  at a reduced dose due to concerns about potential eye side effects and has lost 25 pounds recently.       Previous  HPI 10/07/23 Mindy Grant is a 69 year old female with seronegative rheumatoid arthritis on hydroxychloroquine  400 mg daily here to establish care.   She has a history of rheumatoid arthritis diagnosed since 2020, with worsening joint pain and limited mobility. Her current medication is hydroxychloroquine . She previously tried and discontinued sulfasalazine because it was ineffective. She took methotrexate but had side effects particularly hair loss per outside records. Inflammatory joint pain involving her wrists and fingers and knees.   She also has a history of fibromyalgia, initially attributing her symptoms to it, including pain in her neck, shoulders, and shoulder blades. Over time, she noticed increased difficulty with mobility, particularly when rising from a seated position, and pain in her groin and left leg, which is more problematic than the right. This significantly impacts her ability to walk, describing a sensation of her leg 'tearing out of the joint.'   She underwent carpal tunnel surgery and was initially thought to have a trigger finger, but post-surgery, her symptoms worsened, now being classified as Dupuytren's contracture. 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   She experiences significant knee pain, particularly in the left knee, present for nine to ten months. She describes an inability to squat fully due to swelling and pain in the back of her leg. She has received hip injections approximately three times, with limited relief, and is scheduled for another injection on the sixteenth. She is uncertain about the duration of relief provided by these injections.   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 She has not had any specific injuries like  fractures or dislocations. She has tried various over-the-counter treatments and supplements, including tart cherry, without significant relief. Ibuprofen  600 mg as needed has been partially helpful.   No Rheumatology ROS completed.   PMFS History:  Patient Active Problem List   Diagnosis Date Noted   Generalized osteoarthritis of multiple sites 10/07/2023   Hip arthritis 05/30/2023   Blepharitis of upper and lower eyelids of both eyes 05/24/2023   Dry eye syndrome of both eyes 05/24/2023   High risk medication use 05/24/2023   Abnormal EKG 05/12/2023   DOE (dyspnea on exertion) 05/12/2023   Palpitations 05/12/2023   Precordial chest pain 05/12/2023   Osteopenia 05/03/2023   Flexor tenosynovitis of finger 04/23/2023   Female genital prolapse 11/08/2022   Astigmatism of both eyes with presbyopia 11/01/2022   Corneal scars, both eyes 11/01/2022   Glaucoma suspect, bilateral 11/01/2022   RAD (reactive airway disease) 02/27/2021   GERD (gastroesophageal reflux disease) 01/08/2021   Migraine headache 01/08/2021   Trigger middle finger of left hand 12/11/2020   Carpal tunnel syndrome, left 03/14/2019   Ulnar neuropathy of left upper extremity 03/14/2019   MDD (major depressive disorder) 12/07/2018   Rheumatoid arthritis involving multiple sites (HCC) 12/07/2018   Cupping of optic disc, bilateral 10/30/2018  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   Irritable bowel syndrome with constipation 07/21/2018   Fibromyalgia 03/06/2018   Senile hyperkeratosis 03/06/2018   Mass of right hand 03/02/2018   Chest pain 11/03/2016   Right groin pain 03/18/2016   Pelvic pain in female 12/25/2015   Abdominal bloating 10/28/2015   Leg pain, lateral 06/04/2014   Refusal of blood product 01/08/2014   Acquired hypothyroidism 03/13/2013   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/24/2013   Urinary incontinence 01/24/2013   Rectocele 11/30/2012   Thrombosed external  hemorrhoid 02/16/2012    Past Medical History:  Diagnosis Date   Anxiety    Asthma    as a child-no inhalers now   Complication of anesthesia    takes along time to wake up   Depression    Deviated nasal septum    Diverticulitis    Fibromyalgia    GERD (gastroesophageal reflux disease)    Insomnia    Multiple allergies    Pelvic prolapse    Thyroid disease     Family History  Problem Relation Age of Onset   Cancer Mother        lung   Hypertension Brother    Breast cancer Neg Hx    Past Surgical History:  Procedure Laterality Date   ABDOMINAL HYSTERECTOMY  03/29/1994   BREAST DUCTAL SYSTEM EXCISION  10/06/2011   Procedure: EXCISION DUCTAL SYSTEM BREAST;  Surgeon: Donnice Bury, MD;  Location: [facility];  Service: General;  Laterality: Left;   CARPAL TUNNEL RELEASE Right    CARPAL TUNNEL RELEASE Left 2024   CHOLECYSTECTOMY  01/28/2011   COLON SURGERY  01/15/2014   because of diverticulitis   EXCISIONAL HEMORRHOIDECTOMY     HAND SURGERY Right 2019   HERNIA REPAIR  2016   TONSILLECTOMY     VAGINAL PROLAPSE REPAIR     03/19/2013   Social History   Social History Narrative   Not on file   Immunization History  Administered Date(s) Administered   Tdap 04/10/2021     Objective: Vital Signs: There were no vitals taken for this visit.   Physical Exam   Musculoskeletal Exam: ***   Investigation: No additional findings.  Imaging: No results found.  Recent Labs: Lab Results  Component Value Date   WBC 5.1 10/04/2011   HGB 13.1 10/04/2011   PLT 275 10/04/2011   NA 140 02/20/2024   K 3.9 02/20/2024   CL 105 02/20/2024   CO2 27 02/20/2024   GLUCOSE 117 (H) 02/20/2024   BUN 16 02/20/2024   CREATININE 0.78 02/20/2024   BILITOT 0.3 02/20/2024   AST 16 02/20/2024   ALT 15 02/20/2024   PROT 6.5 02/20/2024   CALCIUM 9.3 02/20/2024   GFRAA >90 10/04/2011    Speciality Comments: No specialty comments available.  Procedures:  No  procedures performed Allergies: Codeine, Bioflavonoid products, Metronidazole, Other, Piper, Proanthocyanidin, and Ondansetron    Assessment / Plan:     Visit Diagnoses:  Assessment & Plan Rheumatoid arthritis of multiple sites with negative rheumatoid factor (HCC)      ***  Follow-Up Instructions: No follow-ups on file.   Briannon Boggio M Clayvon Parlett, CMA  Note - This record has been created using Animal nutritionist.  Chart creation errors have been sought, but may not always  have been located. Such creation errors do not reflect on  the standard of medical care. "

## 2024-05-02 NOTE — Assessment & Plan Note (Signed)
 Mindy Grant

## 2024-05-04 ENCOUNTER — Other Ambulatory Visit: Payer: Self-pay | Admitting: Internal Medicine

## 2024-05-04 DIAGNOSIS — M797 Fibromyalgia: Secondary | ICD-10-CM

## 2024-05-04 NOTE — Telephone Encounter (Signed)
 Last Fill: 02/06/2024  Next Visit: 05/14/2024  Last Visit: 02/20/2024  Dx: Rheumatoid arthritis of multiple sites with negative rheumatoid factor   Current Dose per office note on 02/20/2024: not discussed  Okay to refill Baclofen ?

## 2024-05-14 ENCOUNTER — Ambulatory Visit: Admitting: Internal Medicine

## 2024-05-14 DIAGNOSIS — M0609 Rheumatoid arthritis without rheumatoid factor, multiple sites: Secondary | ICD-10-CM
